# Patient Record
Sex: Female | Born: 1970
Health system: Southern US, Community
[De-identification: ages and names within clinical notes are randomized; demographics above are authoritative.]

## PROBLEM LIST (undated history)

## (undated) DIAGNOSIS — E039 Hypothyroidism, unspecified: Secondary | ICD-10-CM

## (undated) DIAGNOSIS — E119 Type 2 diabetes mellitus without complications: Secondary | ICD-10-CM

## (undated) DIAGNOSIS — K760 Fatty (change of) liver, not elsewhere classified: Secondary | ICD-10-CM

## (undated) DIAGNOSIS — L0390 Cellulitis, unspecified: Secondary | ICD-10-CM

## (undated) DIAGNOSIS — D649 Anemia, unspecified: Secondary | ICD-10-CM

## (undated) DIAGNOSIS — E669 Obesity, unspecified: Secondary | ICD-10-CM

## (undated) DIAGNOSIS — R7303 Prediabetes: Secondary | ICD-10-CM

## (undated) HISTORY — DX: Anemia, unspecified: D64.9

## (undated) HISTORY — DX: Hypothyroidism, unspecified: E03.9

## (undated) HISTORY — PX: ADENOIDECTOMY: SUR15

## (undated) HISTORY — DX: Prediabetes: R73.03

## (undated) HISTORY — DX: Type 2 diabetes mellitus without complications: E11.9

## (undated) HISTORY — DX: Cellulitis, unspecified: L03.90

## (undated) HISTORY — PX: FOOT SURGERY: SHX648

## (undated) HISTORY — DX: Obesity, unspecified: E66.9

---

## 2005-04-13 ENCOUNTER — Ambulatory Visit: Payer: Self-pay

## 2005-05-25 ENCOUNTER — Observation Stay: Payer: Self-pay

## 2005-07-08 ENCOUNTER — Inpatient Hospital Stay: Payer: Self-pay | Admitting: Obstetrics & Gynecology

## 2007-06-22 ENCOUNTER — Ambulatory Visit: Payer: Self-pay | Admitting: Internal Medicine

## 2007-11-26 ENCOUNTER — Other Ambulatory Visit: Payer: Self-pay

## 2007-11-26 ENCOUNTER — Emergency Department: Payer: Self-pay | Admitting: Internal Medicine

## 2007-11-26 ENCOUNTER — Ambulatory Visit: Payer: Self-pay | Admitting: Family Medicine

## 2007-11-27 ENCOUNTER — Ambulatory Visit: Payer: Self-pay | Admitting: Internal Medicine

## 2008-06-29 ENCOUNTER — Ambulatory Visit: Payer: Self-pay | Admitting: Internal Medicine

## 2010-04-05 ENCOUNTER — Ambulatory Visit: Payer: Self-pay | Admitting: Internal Medicine

## 2010-07-03 HISTORY — PX: ESOPHAGOGASTRODUODENOSCOPY: SHX1529

## 2010-12-19 ENCOUNTER — Ambulatory Visit: Payer: Self-pay | Admitting: Gastroenterology

## 2011-01-10 ENCOUNTER — Ambulatory Visit: Payer: Self-pay | Admitting: Internal Medicine

## 2011-01-24 ENCOUNTER — Ambulatory Visit: Payer: Self-pay | Admitting: Otolaryngology

## 2011-06-26 ENCOUNTER — Ambulatory Visit: Payer: Self-pay | Admitting: Internal Medicine

## 2014-07-10 ENCOUNTER — Ambulatory Visit: Payer: Self-pay | Admitting: Registered"

## 2015-01-01 LAB — HM PAP SMEAR: HM Pap smear: NORMAL

## 2015-01-08 ENCOUNTER — Other Ambulatory Visit: Payer: Self-pay | Admitting: Nurse Practitioner

## 2015-01-08 DIAGNOSIS — Z1231 Encounter for screening mammogram for malignant neoplasm of breast: Secondary | ICD-10-CM

## 2015-01-12 ENCOUNTER — Ambulatory Visit
Admission: RE | Admit: 2015-01-12 | Discharge: 2015-01-12 | Disposition: A | Discharge status: Home / Self Care | Payer: BLUE CROSS/BLUE SHIELD | Source: Ambulatory Visit | Attending: Nurse Practitioner | Admitting: Nurse Practitioner

## 2015-01-12 DIAGNOSIS — Z1231 Encounter for screening mammogram for malignant neoplasm of breast: Secondary | ICD-10-CM | POA: Diagnosis not present

## 2015-01-14 ENCOUNTER — Other Ambulatory Visit: Payer: Self-pay | Admitting: Nurse Practitioner

## 2015-01-14 DIAGNOSIS — N6310 Unspecified lump in the right breast, unspecified quadrant: Secondary | ICD-10-CM

## 2015-01-14 DIAGNOSIS — R928 Other abnormal and inconclusive findings on diagnostic imaging of breast: Secondary | ICD-10-CM

## 2015-01-19 ENCOUNTER — Ambulatory Visit
Admission: RE | Admit: 2015-01-19 | Discharge: 2015-01-19 | Disposition: A | Discharge status: Home / Self Care | Payer: BLUE CROSS/BLUE SHIELD | Source: Ambulatory Visit | Attending: Nurse Practitioner | Admitting: Nurse Practitioner

## 2015-01-19 ENCOUNTER — Other Ambulatory Visit: Payer: BLUE CROSS/BLUE SHIELD

## 2015-01-19 DIAGNOSIS — N6310 Unspecified lump in the right breast, unspecified quadrant: Secondary | ICD-10-CM

## 2015-01-19 DIAGNOSIS — R928 Other abnormal and inconclusive findings on diagnostic imaging of breast: Secondary | ICD-10-CM | POA: Insufficient documentation

## 2016-06-04 IMAGING — MG MM DIAG BREAST TOMO UNI RIGHT
4 series · 4 of 8 positions shown · non-contrast
Comparison: 01/12/2015

CLINICAL DATA: 44-year-old female, callback from baseline screening
mammogram for possible right breast mass

EXAM:
DIGITAL DIAGNOSTIC RIGHT MAMMOGRAM WITH 3D TOMOSYNTHESIS WITH CAD
ULTRASOUND RIGHT BREAST

[R MLO]
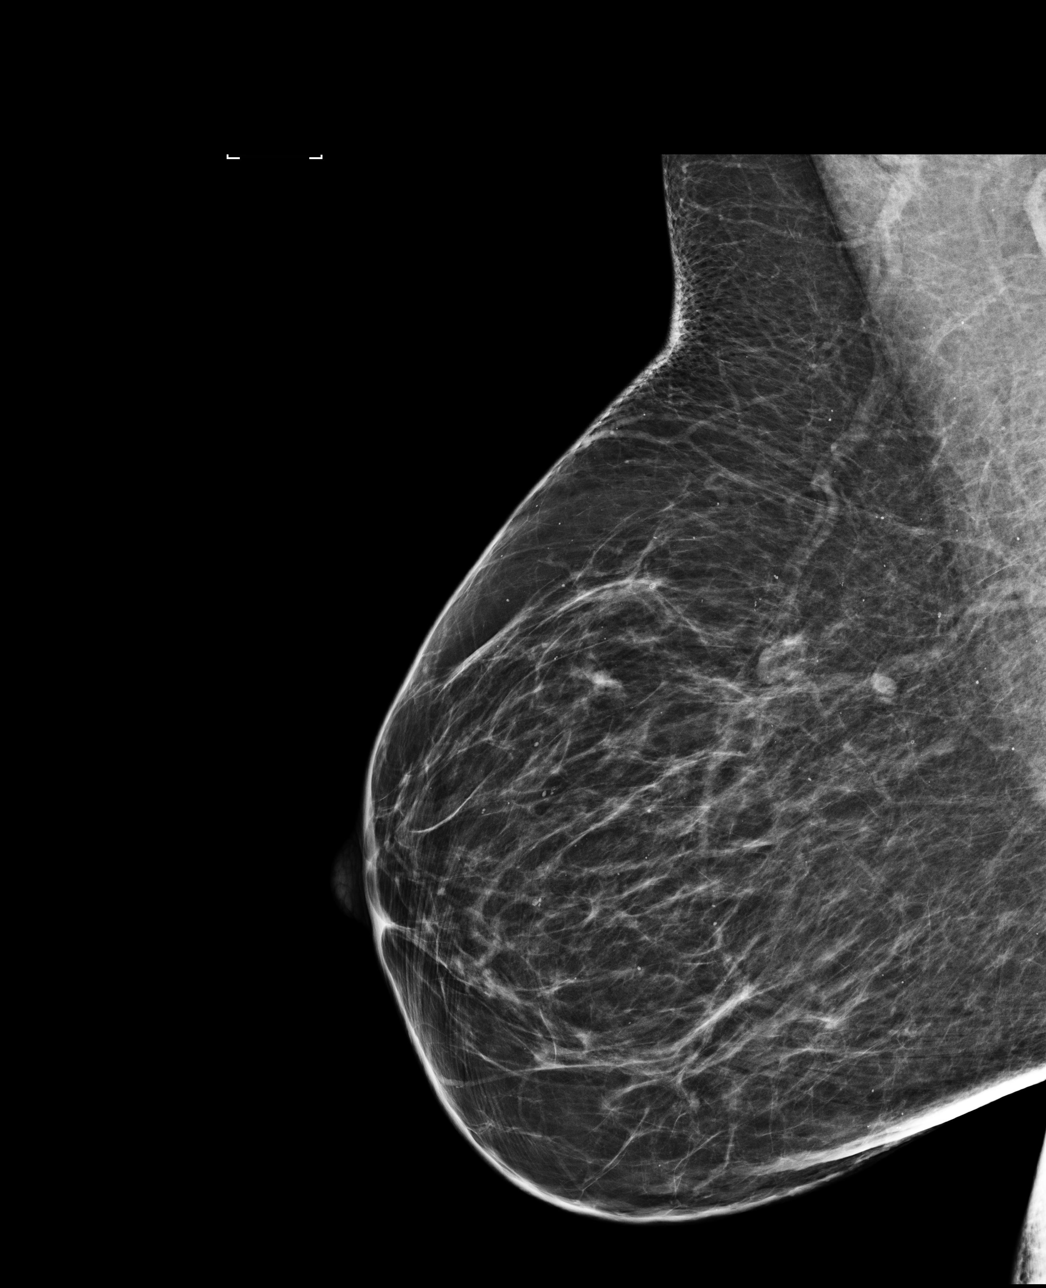

[R MLO synth-2D]
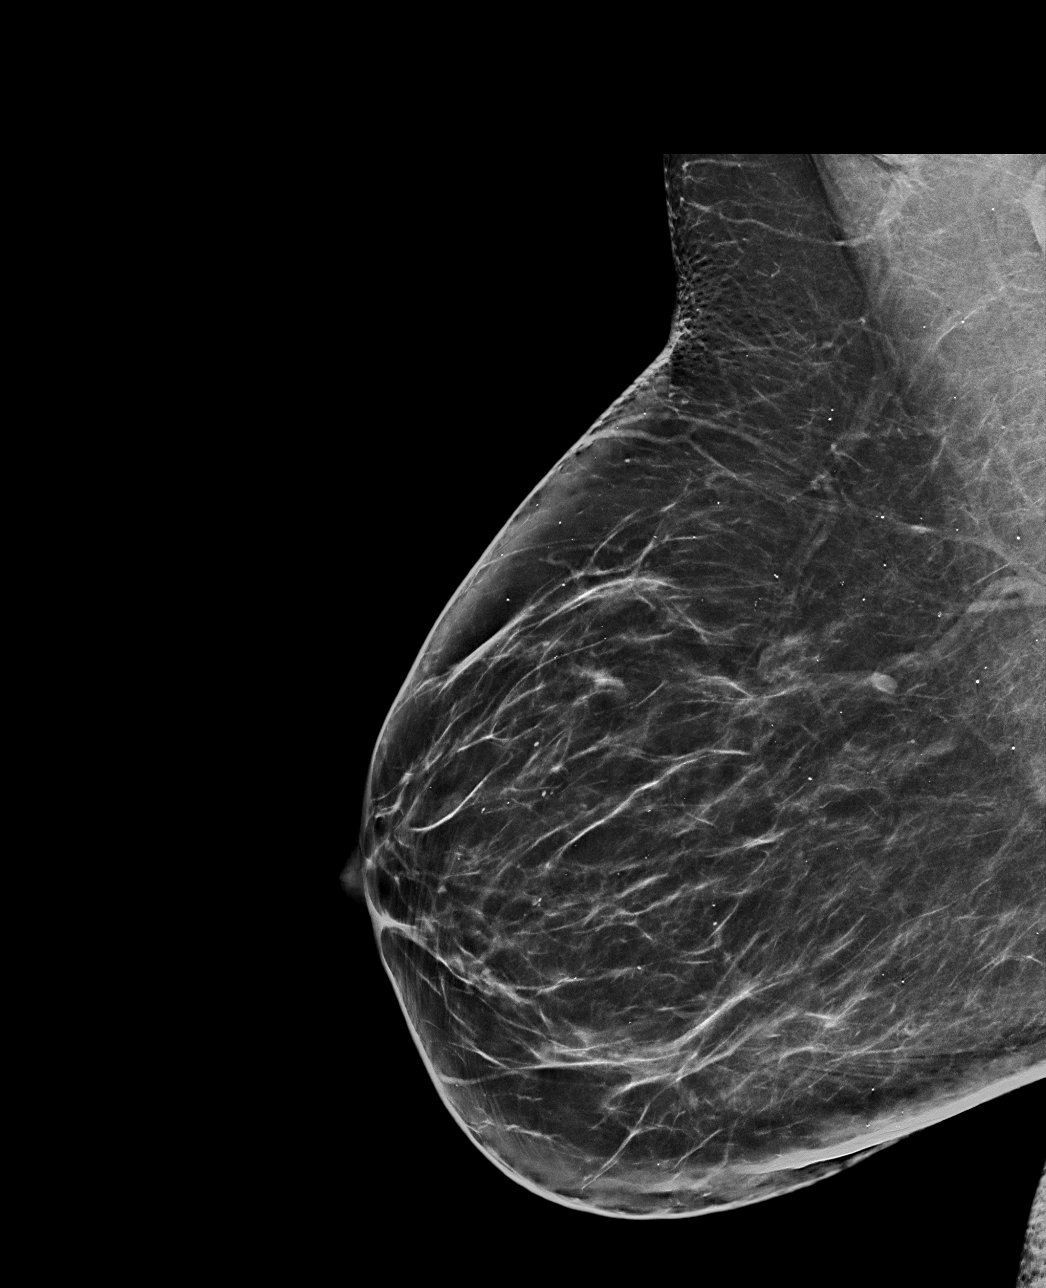

[R CC]
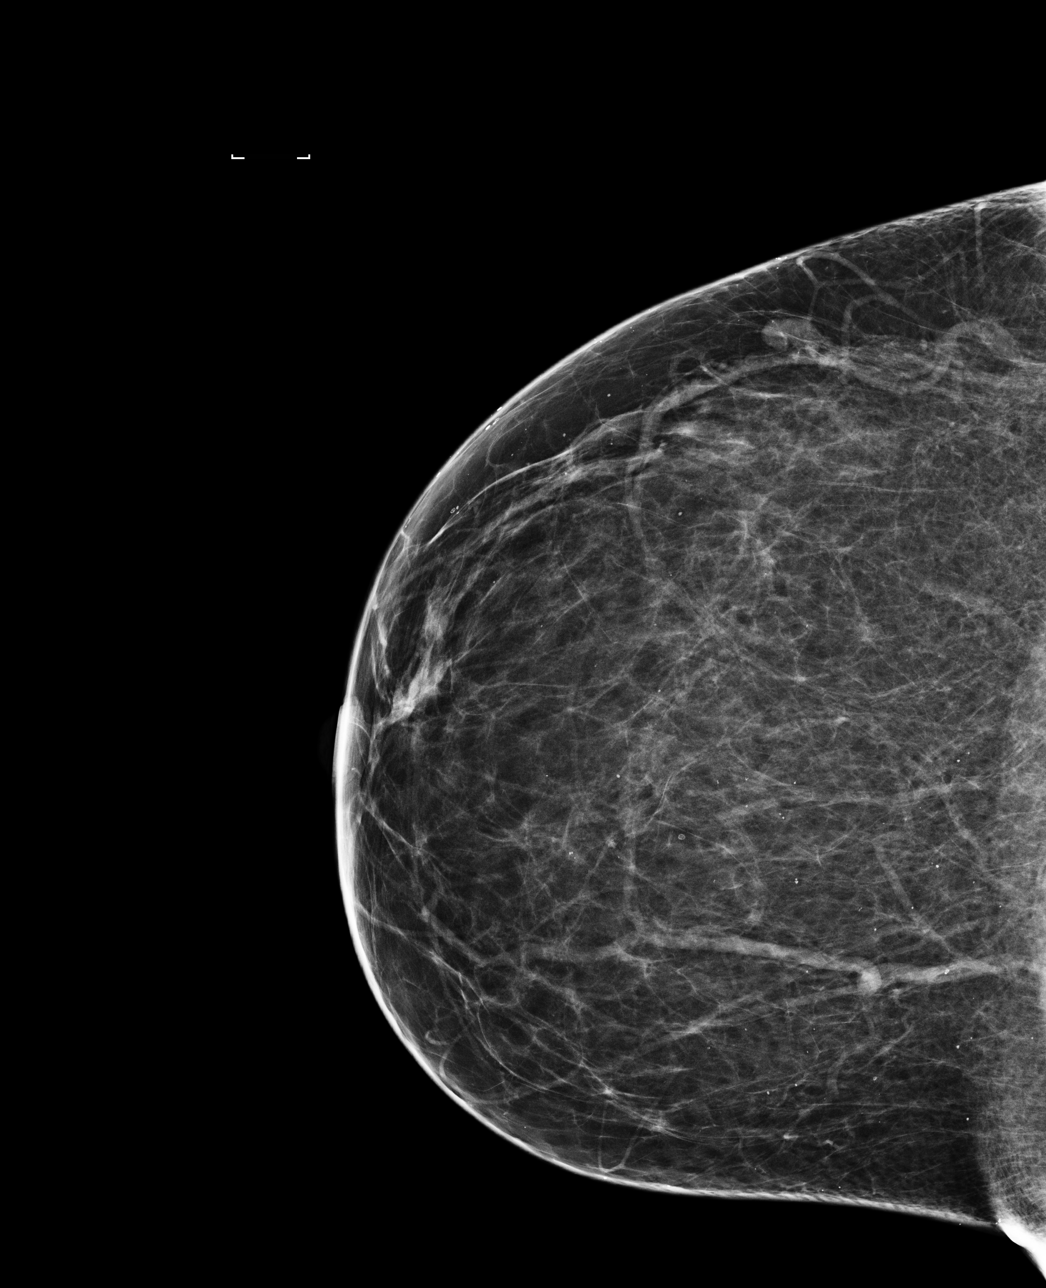

[R MLO tomo · tomo slice 41/80.0]
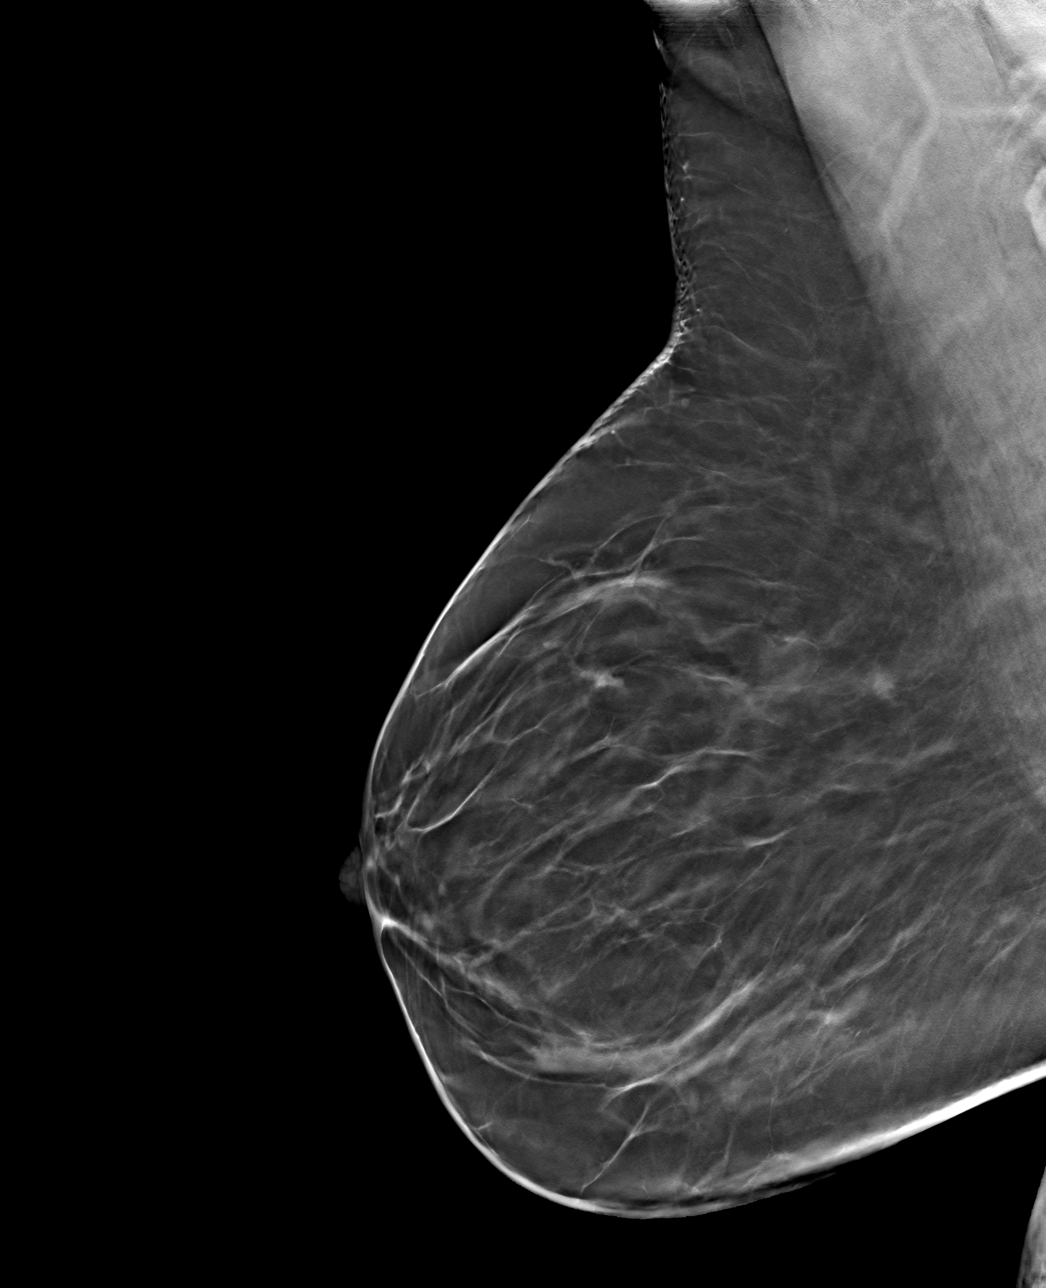

[4 of 8 positions shown; findings below may reference images not displayed]

ACR Breast Density Category b: There are scattered areas of
fibroglandular density.
FINDINGS: CC and MLO views of the right breast with tomosynthesis were
performed. On the additional views, there is an oval, circumscribed,
low-density mass within the upper, outer breast with the suggestion
of a fatty hilum. No additional abnormality is identified.

Mammographic images were processed with CAD.

On physical exam, no discrete mass is felt in the area of concern
within the upper, outer right breast.

Targeted ultrasound is performed, showing a normal appearing
intramammary lymph node at 9 o'clock, 12 cm from the nipple
measuring 10 x 5 x 7 mm, corresponding to the mass seen on
mammogram. No suspicious sonographic finding is seen in this region.
IMPRESSION: No mammographic or sonographic evidence of malignancy.

RECOMMENDATION:
Screening mammogram in one year.(Code:TI-J-ZN2)

I have discussed the findings and recommendations with the patient.
Results were also provided in writing at the conclusion of the
visit. If applicable, a reminder letter will be sent to the patient
regarding the next appointment.

BI-RADS CATEGORY  2: Benign.

## 2017-03-08 ENCOUNTER — Other Ambulatory Visit: Payer: Self-pay | Admitting: Registered"

## 2017-03-08 DIAGNOSIS — R1011 Right upper quadrant pain: Secondary | ICD-10-CM

## 2017-03-08 DIAGNOSIS — K5909 Other constipation: Secondary | ICD-10-CM

## 2017-03-15 ENCOUNTER — Ambulatory Visit
Admission: RE | Admit: 2017-03-15 | Discharge: 2017-03-15 | Disposition: A | Discharge status: Home / Self Care | Payer: BLUE CROSS/BLUE SHIELD | Source: Ambulatory Visit | Attending: Registered" | Admitting: Registered"

## 2017-03-15 DIAGNOSIS — K5909 Other constipation: Secondary | ICD-10-CM

## 2017-03-15 DIAGNOSIS — R1011 Right upper quadrant pain: Secondary | ICD-10-CM | POA: Insufficient documentation

## 2017-03-23 ENCOUNTER — Encounter: Payer: Self-pay | Admitting: Obstetrics and Gynecology

## 2017-03-23 ENCOUNTER — Ambulatory Visit (INDEPENDENT_AMBULATORY_CARE_PROVIDER_SITE_OTHER): Payer: BLUE CROSS/BLUE SHIELD | Admitting: Obstetrics and Gynecology

## 2017-03-23 DIAGNOSIS — N9089 Other specified noninflammatory disorders of vulva and perineum: Secondary | ICD-10-CM

## 2017-03-23 DIAGNOSIS — L720 Epidermal cyst: Secondary | ICD-10-CM | POA: Insufficient documentation

## 2017-03-23 NOTE — Progress Notes (Signed)
Obstetrics & Gynecology Office Visit   Chief Complaint  Patient presents with  . Vulvar lesion   History of Present Illness: 46 y.o. [id number] Who presents with concern for a vulvar lesion. The lesion has been present for the past several months. She has become more concerned about the lesion recently as it has become more firm. She has never had the sort of lesion before. It is located on her left vulva and is about 1 cm long. It is not leaking any fluid nor bleeding. Nothing she has tried makes the lesion smaller or larger. She has no discomfort or any associated symptoms. She has regular menses, which occur every 28 days, and last 5-6 days. Her bleeding she describes as normal for menses. She denies the passage of clots. She denies dysmenorrhea. She denies weight loss, early satiety, new constipation. She denies fevers and chills. She denies any inciting event. Her last Pap smear was about 2 years ago and was normal. She denies a history of abnormal Pap smears. For contraception, she and her husband rely on his vasectomy. She denies a history of sexual transmitted infections.   Past Medical History:  Diagnosis Date  . Hypothyroidism   . Prediabetes    Past Surgical History:  Procedure Laterality Date  . FOOT SURGERY     Gynecologic History: Patient's last menstrual period was 03/05/2017.  Obstetric History: [id number], s/p SVD x 4 without complication  Family History  Problem Relation Age of Onset  . Glaucoma Mother   . Diabetes Mellitus II Paternal Grandfather     Social History   Social History  . Marital status: Married    Spouse name: N/A  . Number of children: N/A  . Years of education: N/A   Occupational History  . Not on file.   Social History Main Topics  . Smoking status: Never Smoker  . Smokeless tobacco: Never Used  . Alcohol use Yes  . Drug use: No  . Sexual activity: Yes    Birth control/ protection: None   Other Topics Concern  . Not on file   Social  History Narrative  . No narrative on file    No Known Allergies  Prior to Admission medications   Medication Sig Start Date End Date Taking? Authorizing Provider  NATURE-THROID 97.5 MG TABS  03/08/17   [provider]    Review of Systems  Constitutional: Negative.   HENT: Negative.   Eyes: Negative.   Respiratory: Negative.   Cardiovascular: Negative.   Gastrointestinal: Negative.   Genitourinary: Negative.   Musculoskeletal: Positive for back pain. Negative for falls, joint pain, myalgias and neck pain.  Skin: Negative.   Neurological: Negative.   Psychiatric/Behavioral: Negative.      Physical Exam BP 126/84   Ht  (1.702 m)   Wt 245 lb (111.1 kg)   LMP 03/05/2017   BMI 38.37 kg/m  Patient's last menstrual period was 03/05/2017. Physical Exam  Constitutional: She is oriented to person, place, and time. She appears well-developed and well-nourished. No distress.  Genitourinary: Vagina normal and uterus normal. Pelvic exam was performed with patient supine. There is no rash, tenderness or lesion on the right labia.  There is lesion (0.8 cm elongated lesion that is white, just below the skin surface, is mobile, nontender, no erythema,) on the left labia. There is no rash or tenderness on the left labia.    Vagina exhibits no lesion. No erythema, tenderness or bleeding in the vagina. No signs of injury  around the vagina. No vaginal discharge found. Right adnexum does not display mass, does not display tenderness and does not display fullness. Left adnexum does not display mass, does not display tenderness and does not display fullness. Cervix does not exhibit motion tenderness, lesion, discharge or polyp.   Uterus is mobile and anteverted. Uterus is not enlarged, tender, exhibiting a mass or irregular (is regular).  HENT:  Head: Normocephalic and atraumatic.  Eyes: No scleral icterus.  Neck: Normal range of motion.  Cardiovascular: Normal rate, regular rhythm  and normal heart sounds.   Pulmonary/Chest: Effort normal and breath sounds normal. No respiratory distress. She has no wheezes. She has no rales. She exhibits no tenderness.  Abdominal: Soft. Bowel sounds are normal. She exhibits no distension and no mass. There is no tenderness. There is no rebound and no guarding. No hernia.  Neurological: She is alert and oriented to person, place, and time. No cranial nerve deficit.  Psychiatric: She has a normal mood and affect. Her behavior is normal. Judgment normal.    Female chaperone present for pelvic and breast  portions of the physical exam  Assessment: 46 y.o. [id number] female with epidermal inclusion cyst of the left vulva.  Plan: Problem List Items Addressed This Visit    Vulvar lesion   Epidermal inclusion cyst    Patient reassured regarding the lesion. She is quite anxious about the lesion. So, will have her follow up in 4-6 months for assurance of stability of the lesion.   Thomasene Mohair, MD 03/23/2017 3:04 PM

## 2017-07-27 ENCOUNTER — Other Ambulatory Visit: Payer: Self-pay | Admitting: Obstetrics and Gynecology

## 2017-08-03 ENCOUNTER — Ambulatory Visit (INDEPENDENT_AMBULATORY_CARE_PROVIDER_SITE_OTHER): Payer: BLUE CROSS/BLUE SHIELD | Admitting: Obstetrics and Gynecology

## 2017-08-03 ENCOUNTER — Encounter: Payer: Self-pay | Admitting: Obstetrics and Gynecology

## 2017-08-03 VITALS — BP 138/86 | Ht 66.0 in | Wt 246.0 lb

## 2017-08-03 DIAGNOSIS — N644 Mastodynia: Secondary | ICD-10-CM | POA: Diagnosis not present

## 2017-08-03 NOTE — Progress Notes (Signed)
Obstetrics & Gynecology Office Visit   Chief Complaint  Patient presents with  . Breast Problem   History of Present Illness: 47 y.o. G43P4004 female who presents with mild discomfort on her right lateral breast for the past week.  She states the pain is to the far lateral aspect of her breast area.  She denies skin changes, no bumps noted. She denies recent trauma to the area.  She denies leakage of blood or fluid from her nipple area.    Past Medical History:  Diagnosis Date  . Hypothyroidism   . Prediabetes     Past Surgical History:  Procedure Laterality Date  . FOOT SURGERY      Gynecologic History: No LMP recorded.  Obstetric History: [id number]  Family History  Problem Relation Age of Onset  . Glaucoma Mother   . Diabetes Mellitus II Paternal Grandfather     Social History   Socioeconomic History  . Marital status: Married    Spouse name: Not on file  . Number of children: Not on file  . Years of education: Not on file  . Highest education level: Not on file  Social Needs  . Financial resource strain: Not on file  . Food insecurity - worry: Not on file  . Food insecurity - inability: Not on file  . Transportation needs - medical: Not on file  . Transportation needs - non-medical: Not on file  Occupational History  . Not on file  Tobacco Use  . Smoking status: Never Smoker  . Smokeless tobacco: Never Used  Substance and Sexual Activity  . Alcohol use: Yes  . Drug use: No  . Sexual activity: Yes    Birth control/protection: None  Other Topics Concern  . Not on file  Social History Narrative  . Not on file    No Known Allergies  Prior to Admission medications   Medication Sig Start Date End Date Taking? Authorizing Provider  NATURE-THROID 97.5 MG TABS  03/08/17   [provider]    Review of Systems  Constitutional: Negative.   HENT: Negative.   Eyes: Negative.   Respiratory: Negative.   Cardiovascular: Negative.   Gastrointestinal:  Negative.   Genitourinary: Negative.   Musculoskeletal: Negative.   Skin: Negative.        Except as noted in the HPI  Neurological: Negative.   Psychiatric/Behavioral: Negative.      Physical Exam BP 138/86   Ht 5\' 6"  (1.676 m)   Wt 246 lb (111.6 kg)   BMI 39.71 kg/m  No LMP recorded. Physical Exam  Constitutional: She is oriented to person, place, and time. She appears well-developed and well-nourished. No distress.  HENT:  Head: Normocephalic and atraumatic.  Eyes: Conjunctivae are normal. No scleral icterus.  Neck: Normal range of motion. Neck supple. No thyromegaly present.  Cardiovascular: Normal rate and regular rhythm.  Pulmonary/Chest: Effort normal and breath sounds normal. No respiratory distress. She exhibits tenderness (mild ttp overlying anterior latissimus dorsi of her right side). She exhibits no mass. Right breast exhibits no inverted nipple, no mass, no nipple discharge, no skin change and no tenderness. Left breast exhibits no inverted nipple, no mass, no nipple discharge and no skin change.  Musculoskeletal: Normal range of motion. She exhibits no edema.  Lymphadenopathy:    She has no cervical adenopathy.  Neurological: She is alert and oriented to person, place, and time. No cranial nerve deficit.  Psychiatric: She has a normal mood and affect. Her behavior is normal. Judgment  normal.   Female chaperone present for pelvic and breast  portions of the physical exam.  Patient's breast examined in the upright and recumbent positions.   Assessment: 47 y.o. [id number] female here for  1. Breast tenderness      Plan: Problem List Items Addressed This Visit    None    Visit Diagnoses    Breast tenderness    -  Primary     Patient reassured with no lesion noted.  She is strongly encouraged to get a routine screening mammogram since no focal lesions were identified and her tenderness appears to be overlying a muscles with no overlying breast tissue.  Thomasene MohairStephen  Kirbie Stodghill, MD 08/06/2017 1:53 PM

## 2017-08-06 ENCOUNTER — Encounter: Payer: Self-pay | Admitting: Obstetrics and Gynecology

## 2018-01-14 DIAGNOSIS — E559 Vitamin D deficiency, unspecified: Secondary | ICD-10-CM | POA: Diagnosis not present

## 2018-01-14 DIAGNOSIS — E538 Deficiency of other specified B group vitamins: Secondary | ICD-10-CM | POA: Diagnosis not present

## 2018-01-14 DIAGNOSIS — E063 Autoimmune thyroiditis: Secondary | ICD-10-CM | POA: Diagnosis not present

## 2018-01-14 DIAGNOSIS — R7982 Elevated C-reactive protein (CRP): Secondary | ICD-10-CM | POA: Diagnosis not present

## 2018-01-23 DIAGNOSIS — F430 Acute stress reaction: Secondary | ICD-10-CM | POA: Diagnosis not present

## 2018-01-23 DIAGNOSIS — R5383 Other fatigue: Secondary | ICD-10-CM | POA: Diagnosis not present

## 2018-01-23 DIAGNOSIS — E119 Type 2 diabetes mellitus without complications: Secondary | ICD-10-CM | POA: Diagnosis not present

## 2018-01-23 DIAGNOSIS — R7982 Elevated C-reactive protein (CRP): Secondary | ICD-10-CM | POA: Diagnosis not present

## 2018-01-30 ENCOUNTER — Other Ambulatory Visit: Payer: Self-pay

## 2018-01-30 ENCOUNTER — Encounter: Payer: Self-pay | Admitting: Emergency Medicine

## 2018-01-30 ENCOUNTER — Ambulatory Visit (INDEPENDENT_AMBULATORY_CARE_PROVIDER_SITE_OTHER)
Admit: 2018-01-30 | Discharge: 2018-01-30 | Disposition: A | Discharge status: Home / Self Care | Payer: BLUE CROSS/BLUE SHIELD | Attending: Family Medicine | Admitting: Family Medicine

## 2018-01-30 ENCOUNTER — Ambulatory Visit
Admission: EM | Admit: 2018-01-30 | Discharge: 2018-01-30 | Disposition: A | Discharge status: Home / Self Care | Payer: BLUE CROSS/BLUE SHIELD | Attending: Family Medicine | Admitting: Family Medicine

## 2018-01-30 DIAGNOSIS — M7989 Other specified soft tissue disorders: Secondary | ICD-10-CM

## 2018-01-30 DIAGNOSIS — R2241 Localized swelling, mass and lump, right lower limb: Secondary | ICD-10-CM | POA: Diagnosis not present

## 2018-01-30 DIAGNOSIS — L559 Sunburn, unspecified: Secondary | ICD-10-CM

## 2018-01-30 DIAGNOSIS — R600 Localized edema: Secondary | ICD-10-CM | POA: Diagnosis not present

## 2018-01-30 DIAGNOSIS — M79661 Pain in right lower leg: Secondary | ICD-10-CM

## 2018-01-30 MED ORDER — DOXYCYCLINE HYCLATE 100 MG PO CAPS: 100.0000 mg | ORAL_CAPSULE | Freq: Two times a day (BID) | ORAL | 0 refills | Status: DC

## 2018-01-30 NOTE — ED Provider Notes (Signed)
[facility]    CSN: [id number] Arrival date & time: 01/30/18  [id number]  History   Chief Complaint Chief Complaint  Patient presents with  . Leg Swelling   HPI  47 year old female presents with right leg swelling.  Patient recently suffered a severe sunburn on both of her lower legs.  She states that this is now improving.  However, over the past 12 days following the burn she has had leg swelling, particularly of the right leg.  She has recently had long travel.  She states that she traveled 9 hours to [place].  She has been elevating her leg and using over-the-counter topicals for her sunburn.  She has had improvement in her burn but has yet to have improvement in her swelling.  No reports of shortness of breath.  No known relieving factors.  Additionally, patient has an open wound at the back of her right knee.  This was from a ruptured blister.  No other reported symptoms.  No other complaints.  Past Medical History:  Diagnosis Date  . Hypothyroidism   . Prediabetes    Patient Active Problem List   Diagnosis Date Noted  . Vulvar lesion 03/23/2017  . Epidermal inclusion cyst 03/23/2017   Past Surgical History:  Procedure Laterality Date  . FOOT SURGERY     OB History    Gravida  4   Para  4   Term  4   Preterm      AB      Living  4     SAB      TAB      Ectopic      Multiple      Live Births  4            Home Medications    Prior to Admission medications   Medication Sig Start Date End Date Taking? Authorizing Provider  metFORMIN (GLUCOPHAGE) 850 MG tablet  01/23/18  Yes [provider]  NATURE-THROID 97.5 MG TABS  03/08/17  Yes [provider]  doxycycline (VIBRAMYCIN) 100 MG capsule Take 1 capsule (100 mg total) by mouth 2 (two) times daily. 01/30/18   Tommie Sams, DO    Family History Family History  Problem Relation Age of Onset  . Glaucoma Mother   . Diabetes Mellitus II Paternal Grandfather   .  Hypertension Father     Social History Social History   Tobacco Use  . Smoking status: Never Smoker  . Smokeless tobacco: Never Used  Substance Use Topics  . Alcohol use: Yes  . Drug use: No     Allergies   Patient has no known allergies.   Review of Systems Review of Systems  Respiratory: Negative.   Cardiovascular: Positive for leg swelling.  Skin:       Sunburn.    Physical Exam Triage Vital Signs ED Triage Vitals  Enc Vitals Group     BP 01/30/18 1008 (!) 141/80     Pulse Rate 01/30/18 1008 60     Resp 01/30/18 1008 18     Temp 01/30/18 1008 99 F (37.2 C)     Temp Source 01/30/18 1008 Oral     SpO2 01/30/18 1008 100 %     Weight 01/30/18 1005 240 lb (108.9 kg)     Height 01/30/18 1005 5\' 7"  (1.702 m)     Head Circumference --      Peak Flow --      Pain Score 01/30/18 1005  2     Pain Loc --      Pain Edu? --      Excl. in GC? --    Updated Vital Signs BP (!) 141/80 (BP Location: Right Arm)   Pulse 60   Temp 99 F (37.2 C) (Oral)   Resp 18   Ht 5\' 7"  (1.702 m)   Wt 240 lb (108.9 kg)   LMP 01/28/2018   SpO2 100%   BMI 37.59 kg/m   Visual Acuity Right Eye Distance:   Left Eye Distance:   Bilateral Distance:    Right Eye Near:   Left Eye Near:    Bilateral Near:     Physical Exam  Constitutional: She is oriented to person, place, and time. She appears well-developed. No distress.  Pulmonary/Chest: Effort normal. No respiratory distress.  Musculoskeletal:  Right lower leg -patient with 1-2+ edema.  Her right calf is considerably larger than the left.  Measures 52 cm versus 47 cm.  Neurological: She is alert and oriented to person, place, and time.  Skin:  Right lower extremity with erythema posteriorly.  Mild warmth.  Psychiatric: She has a normal mood and affect. Her behavior is normal.  Nursing note and vitals reviewed.  UC Treatments / Results  Labs (all labs ordered are listed, but only abnormal results are displayed) Labs Reviewed  - No data to display  EKG None  Radiology [place] Venous Img Lower Unilateral Right  Result Date: 01/30/2018 CLINICAL DATA:  Right lower extremity pain and edema for the past 2 weeks. Evaluate for DVT. EXAM: RIGHT LOWER EXTREMITY VENOUS DOPPLER ULTRASOUND TECHNIQUE: Gray-scale sonography with graded compression, as well as color Doppler and duplex ultrasound were performed to evaluate the lower extremity deep venous systems from the level of the common femoral vein and including the common femoral, femoral, profunda femoral, popliteal and calf veins including the posterior tibial, peroneal and gastrocnemius veins when visible. The superficial great saphenous vein was also interrogated. Spectral Doppler was utilized to evaluate flow at rest and with distal augmentation maneuvers in the common femoral, femoral and popliteal veins. COMPARISON:  None. FINDINGS: Contralateral Common Femoral Vein: Respiratory phasicity is normal and symmetric with the symptomatic side. No evidence of thrombus. Normal compressibility. Common Femoral Vein: No evidence of thrombus. Normal compressibility, respiratory phasicity and response to augmentation. Saphenofemoral Junction: No evidence of thrombus. Normal compressibility and flow on color Doppler imaging. Profunda Femoral Vein: No evidence of thrombus. Normal compressibility and flow on color Doppler imaging. Femoral Vein: No evidence of thrombus. Normal compressibility, respiratory phasicity and response to augmentation. Popliteal Vein: No evidence of thrombus. Normal compressibility, respiratory phasicity and response to augmentation. Calf Veins: Appear patent where imaged. Superficial Great Saphenous Vein: No evidence of thrombus. Normal compressibility. Venous Reflux:  None. Other Findings:  None. IMPRESSION: No evidence of DVT within the right lower extremity. Electronically Signed   By: Simonne ComeJohn  Watts M.D.   On: 01/30/2018 11:16    Procedures Procedures (including critical  care time)  Medications Ordered in UC Medications - No data to display  Initial Impression / Assessment and Plan / UC Course  I have reviewed the triage vital signs and the nursing notes.  Pertinent labs & imaging results that were available during my care of the patient were reviewed by me and considered in my medical decision making (see chart for details).    47 year old female presents with right leg swelling. Concern for DVT. US obtained today and was negative.  Slight  concern for cellulitis.  However, it is difficult to tail in the setting of sunburn.  Advised continue supportive care.  If fails to improve or worsens, start doxycycline.  Final Clinical Impressions(s) / UC Diagnoses   Final diagnoses:  Right leg swelling     Discharge Instructions     Rest, elevate.  Compression.  Antibiotic if you fail to improve or worsen.  Take care  Dr. Adriana Simas     ED Prescriptions    Medication Sig Dispense Auth. Provider   doxycycline (VIBRAMYCIN) 100 MG capsule Take 1 capsule (100 mg total) by mouth 2 (two) times daily. 14 capsule Tommie Sams, DO     Controlled Substance Prescriptions [place] Controlled Substance Registry consulted? Not Applicable   Tommie Sams, DO 01/30/18 1136

## 2018-01-30 NOTE — Discharge Instructions (Signed)
Rest, elevate.  Compression.  Antibiotic if you fail to improve or worsen.  Take care  Dr. Adriana Simasook

## 2018-01-30 NOTE — ED Triage Notes (Signed)
Patient c/o sunburn to her legs 12 days ago. Patient now reports swelling to her right leg and tenderness that has improved.

## 2018-02-11 ENCOUNTER — Ambulatory Visit
Admission: EM | Admit: 2018-02-11 | Discharge: 2018-02-11 | Disposition: A | Discharge status: Home / Self Care | Payer: BLUE CROSS/BLUE SHIELD | Attending: Family Medicine | Admitting: Family Medicine

## 2018-02-11 ENCOUNTER — Other Ambulatory Visit: Payer: Self-pay

## 2018-02-11 DIAGNOSIS — R400 Somnolence: Secondary | ICD-10-CM | POA: Diagnosis not present

## 2018-02-11 DIAGNOSIS — R5383 Other fatigue: Secondary | ICD-10-CM | POA: Diagnosis not present

## 2018-02-11 DIAGNOSIS — T383X5A Adverse effect of insulin and oral hypoglycemic [antidiabetic] drugs, initial encounter: Secondary | ICD-10-CM | POA: Diagnosis not present

## 2018-02-11 LAB — COMPREHENSIVE METABOLIC PANEL
ALK PHOS: 69 U/L (ref 38–126)
ALT: 19 U/L (ref 0–44)
AST: 23 U/L (ref 15–41)
Albumin: 4.2 g/dL (ref 3.5–5.0)
Anion gap: 10 (ref 5–15)
BUN: 14 mg/dL (ref 6–20)
CALCIUM: 9 mg/dL (ref 8.9–10.3)
CO2: 23 mmol/L (ref 22–32)
CREATININE: 0.69 mg/dL (ref 0.44–1.00)
Chloride: 105 mmol/L (ref 98–111)
Glucose, Bld: 112 mg/dL — ABNORMAL HIGH (ref 70–99)
Potassium: 4.1 mmol/L (ref 3.5–5.1)
Sodium: 138 mmol/L (ref 135–145)
TOTAL PROTEIN: 8 g/dL (ref 6.5–8.1)
Total Bilirubin: 0.4 mg/dL (ref 0.3–1.2)

## 2018-02-11 NOTE — ED Provider Notes (Signed)
[facility]    CSN: [id number] Arrival date & time: 02/11/18  [id number]   History   Chief Complaint Drowsiness  HPI   47 year old female presents with drowsiness.  Patient states that she recently started taking metformin.  She states that it was approximately 1 week ago.  She states that she has had ongoing drowsiness.  She is concerned that she is having a medication side effect.  She has now stopped the metformin and seems to be improving.  She states that in addition to the drowsiness, she is been having "brain fog" and fatigue.  Symptoms are now improving.  Patient is concerned that she has lactic acidosis because she read this side effect online.  No fevers or chills.  No reports of diarrhea.  No other associated symptoms.  No other complaints.  Past Medical History:  Diagnosis Date  . Hypothyroidism   . Prediabetes     Patient Active Problem List   Diagnosis Date Noted  . Vulvar lesion 03/23/2017  . Epidermal inclusion cyst 03/23/2017    Past Surgical History:  Procedure Laterality Date  . FOOT SURGERY      OB History    Gravida  4   Para  4   Term  4   Preterm      AB      Living  4     SAB      TAB      Ectopic      Multiple      Live Births  4            Home Medications    Prior to Admission medications   Medication Sig Start Date End Date Taking? Authorizing Provider  NATURE-THROID 97.5 MG TABS  03/08/17  Yes [provider]    Family History Family History  Problem Relation Age of Onset  . Glaucoma Mother   . Diabetes Mellitus II Paternal Grandfather   . Hypertension Father     Social History Social History   Tobacco Use  . Smoking status: Never Smoker  . Smokeless tobacco: Never Used  Substance Use Topics  . Alcohol use: Yes    Comment: rare  . Drug use: No     Allergies   Patient has no known allergies.   Review of Systems Review of Systems  Constitutional: Positive for fatigue.    Neurological:       Drowsiness.   Physical Exam Triage Vital Signs ED Triage Vitals  Enc Vitals Group     BP 02/11/18 1011 (!) 148/87     Pulse Rate 02/11/18 1011 (!) 59     Resp 02/11/18 1011 18     Temp 02/11/18 1011 98.5 F (36.9 C)     Temp Source 02/11/18 1011 Oral     SpO2 02/11/18 1011 99 %     Weight 02/11/18 1008 240 lb (108.9 kg)     Height 02/11/18 1008 5\' 7"  (1.702 m)     Head Circumference --      Peak Flow --      Pain Score 02/11/18 1008 0     Pain Loc --      Pain Edu? --      Excl. in GC? --    Updated Vital Signs BP (!) 148/87 (BP Location: Left Arm)   Pulse (!) 59   Temp 98.5 F (36.9 C) (Oral)   Resp 18   Ht 5\' 7"  (1.702 m)   Wt 108.9 kg  LMP 01/28/2018   SpO2 99%   BMI 37.59 kg/m   Visual Acuity Right Eye Distance:   Left Eye Distance:   Bilateral Distance:    Right Eye Near:   Left Eye Near:    Bilateral Near:     Physical Exam  Constitutional: She is oriented to person, place, and time. She appears well-developed. No distress.  HENT:  Head: Normocephalic and atraumatic.  Cardiovascular: Normal rate and regular rhythm.  Pulmonary/Chest: Effort normal. She has no wheezes. She has no rales.  Neurological: She is alert and oriented to person, place, and time.  Psychiatric: She has a normal mood and affect. Her behavior is normal.  Nursing note and vitals reviewed.  UC Treatments / Results  Labs (all labs ordered are listed, but only abnormal results are displayed) Labs Reviewed  COMPREHENSIVE METABOLIC PANEL - Abnormal; Notable for the following components:      Result Value   Glucose, Bld 112 (*)    All other components within normal limits    EKG None  Radiology No results found.  Procedures Procedures (including critical care time)  Medications Ordered in UC Medications - No data to display  Initial Impression / Assessment and Plan / UC Course  I have reviewed the triage vital signs and the nursing  notes.  Pertinent labs & imaging results that were available during my care of the patient were reviewed by me and considered in my medical decision making (see chart for details).    47 year old female presents with an adverse medication side effect.  Improving since she stopped metformin.  Continue to not take metformin.  Discussed with her primary care physician about further treatment of her blood sugars/insulin resistance.  Metabolic panel obtained today and was unremarkable.  Final Clinical Impressions(s) / UC Diagnoses   Final diagnoses:  Adverse effect of metformin, initial encounter     Discharge Instructions     No more metformin.  Labs    ED Prescriptions    None     Controlled Substance Prescriptions [place] Controlled Substance Registry consulted? Not Applicable   Tommie SamsCook, Huckleberry Martinson G, DO 02/11/18 1245

## 2018-02-11 NOTE — ED Triage Notes (Signed)
Patient states that she recently started metformin around 3 weeks ago and stopped it on Saturday. Patient states that she was having drowsiness. Patient states that she googled symptoms and thought it could possibly be lactic acidosis. Patient states that she has noticed some brain fog and tiredness. States that since stopping the medication it has improved.   Patient would also like for Dr. Adriana Simasook to look at her legs since finishing the doxy. Reports resolving symptoms.

## 2018-02-11 NOTE — Discharge Instructions (Signed)
No more metformin.  Labs

## 2018-04-08 DIAGNOSIS — M25571 Pain in right ankle and joints of right foot: Secondary | ICD-10-CM | POA: Diagnosis not present

## 2018-05-07 DIAGNOSIS — D508 Other iron deficiency anemias: Secondary | ICD-10-CM | POA: Diagnosis not present

## 2018-05-07 DIAGNOSIS — R7982 Elevated C-reactive protein (CRP): Secondary | ICD-10-CM | POA: Diagnosis not present

## 2018-05-07 DIAGNOSIS — E538 Deficiency of other specified B group vitamins: Secondary | ICD-10-CM | POA: Diagnosis not present

## 2018-05-07 DIAGNOSIS — E559 Vitamin D deficiency, unspecified: Secondary | ICD-10-CM | POA: Diagnosis not present

## 2018-05-07 DIAGNOSIS — E063 Autoimmune thyroiditis: Secondary | ICD-10-CM | POA: Diagnosis not present

## 2018-05-07 DIAGNOSIS — E119 Type 2 diabetes mellitus without complications: Secondary | ICD-10-CM | POA: Diagnosis not present

## 2018-05-16 DIAGNOSIS — F430 Acute stress reaction: Secondary | ICD-10-CM | POA: Diagnosis not present

## 2018-05-16 DIAGNOSIS — E119 Type 2 diabetes mellitus without complications: Secondary | ICD-10-CM | POA: Diagnosis not present

## 2018-05-16 DIAGNOSIS — R5383 Other fatigue: Secondary | ICD-10-CM | POA: Diagnosis not present

## 2018-05-16 DIAGNOSIS — R7982 Elevated C-reactive protein (CRP): Secondary | ICD-10-CM | POA: Diagnosis not present

## 2018-06-10 DIAGNOSIS — D649 Anemia, unspecified: Secondary | ICD-10-CM | POA: Insufficient documentation

## 2018-06-10 DIAGNOSIS — F988 Other specified behavioral and emotional disorders with onset usually occurring in childhood and adolescence: Secondary | ICD-10-CM | POA: Diagnosis not present

## 2018-06-10 DIAGNOSIS — E669 Obesity, unspecified: Secondary | ICD-10-CM | POA: Diagnosis not present

## 2018-06-10 DIAGNOSIS — R7303 Prediabetes: Secondary | ICD-10-CM | POA: Diagnosis not present

## 2018-06-10 DIAGNOSIS — E559 Vitamin D deficiency, unspecified: Secondary | ICD-10-CM | POA: Diagnosis not present

## 2018-07-26 ENCOUNTER — Other Ambulatory Visit: Payer: Self-pay | Admitting: Obstetrics and Gynecology

## 2018-07-26 DIAGNOSIS — Z1231 Encounter for screening mammogram for malignant neoplasm of breast: Secondary | ICD-10-CM

## 2018-07-30 ENCOUNTER — Ambulatory Visit
Admit: 2018-07-30 | Discharge: 2018-07-30 | Disposition: A | Discharge status: Home / Self Care | Payer: BLUE CROSS/BLUE SHIELD | Attending: Family Medicine | Admitting: Family Medicine

## 2018-07-30 ENCOUNTER — Ambulatory Visit
Admission: EM | Admit: 2018-07-30 | Discharge: 2018-07-30 | Disposition: A | Discharge status: Home / Self Care | Payer: BLUE CROSS/BLUE SHIELD | Attending: Family Medicine | Admitting: Family Medicine

## 2018-07-30 ENCOUNTER — Other Ambulatory Visit: Payer: Self-pay

## 2018-07-30 DIAGNOSIS — R1011 Right upper quadrant pain: Secondary | ICD-10-CM | POA: Insufficient documentation

## 2018-07-30 DIAGNOSIS — K828 Other specified diseases of gallbladder: Secondary | ICD-10-CM | POA: Diagnosis not present

## 2018-07-30 NOTE — ED Provider Notes (Signed)
[facility]    CSN: [id number] Arrival date & time: 07/30/18  0947  History   Chief Complaint Chief Complaint  Patient presents with  . Back Pain    appt   HPI  48 year old female presents with multiple complaints.  Patient reports that she has not been feeling well for the past few months.  She has a "sensation" in her back.  She states that it comes and goes.  She states that she feels like something is wrong.  It is located in her upper back.  She reports that she is also had some "right side cramping".  Tends to occur with meals.  She believes she may have a gallbladder issue.  She also endorses constipation, nausea, decreased appetite.  She also mentions that she feels like she is having issues with sciatica.  She has had calf cramping intermittently.  Patient also states that she has had some "esophageal spasm".  No known relieving factors.   PMH, Surgical Hx, Family Hx, Social History reviewed and updated as below.  Past Medical History:  Diagnosis Date  . Hypothyroidism   . Prediabetes    Patient Active Problem List   Diagnosis Date Noted  . Vulvar lesion 03/23/2017  . Epidermal inclusion cyst 03/23/2017   Past Surgical History:  Procedure Laterality Date  . FOOT SURGERY      OB History    Gravida  4   Para  4   Term  4   Preterm      AB      Living  4     SAB      TAB      Ectopic      Multiple      Live Births  4           Home Medications    Prior to Admission medications   Medication Sig Start Date End Date Taking? Authorizing Provider  NATURE-THROID 97.5 MG TABS  03/08/17  Yes [provider]    Family History Family History  Problem Relation Age of Onset  . Glaucoma Mother   . Diabetes Mellitus II Paternal Grandfather   . Hypertension Father     Social History Social History   Tobacco Use  . Smoking status: Never Smoker  . Smokeless tobacco: Never Used  Substance Use Topics  . Alcohol use: Yes   Comment: rare  . Drug use: No     Allergies   Patient has no known allergies.   Review of Systems Review of Systems Per HPI  Physical Exam Triage Vital Signs ED Triage Vitals  Enc Vitals Group     BP 07/30/18 1003 (!) 141/70     Pulse Rate 07/30/18 1003 (!) 58     Resp 07/30/18 1003 18     Temp 07/30/18 1003 98.3 F (36.8 C)     Temp Source 07/30/18 1003 Oral     SpO2 07/30/18 1003 100 %     Weight 07/30/18 1001 230 lb (104.3 kg)     Height 07/30/18 1001 5' 7.75" (1.721 m)     Head Circumference --      Peak Flow --      Pain Score 07/30/18 1001 1     Pain Loc --      Pain Edu? --      Excl. in GC? --    Updated Vital Signs BP (!) 141/70 (BP Location: Right Arm)   Pulse (!) 58   Temp 98.3  F (36.8 C) (Oral)   Resp 18   Ht 5' 7.75" (1.721 m)   Wt 104.3 kg   LMP 07/21/2018   SpO2 100%   BMI 35.23 kg/m   Visual Acuity Right Eye Distance:   Left Eye Distance:   Bilateral Distance:    Right Eye Near:   Left Eye Near:    Bilateral Near:     Physical Exam Vitals signs and nursing note reviewed.  Constitutional:      General: She is not in acute distress.    Appearance: Normal appearance.  HENT:     Head: Normocephalic and atraumatic.     Nose: Nose normal. No rhinorrhea.  Eyes:     General:        Right eye: No discharge.        Left eye: No discharge.     Conjunctiva/sclera: Conjunctivae normal.  Cardiovascular:     Rate and Rhythm: Normal rate and regular rhythm.  Pulmonary:     Effort: Pulmonary effort is normal.     Breath sounds: No wheezing, rhonchi or rales.  Abdominal:     General: There is no distension.     Palpations: Abdomen is soft.     Tenderness: There is no abdominal tenderness.  Neurological:     General: No focal deficit present.     Mental Status: She is alert and oriented to person, place, and time.  Psychiatric:        Mood and Affect: Mood normal.        Behavior: Behavior normal.    UC Treatments / Results   Labs (all labs ordered are listed, but only abnormal results are displayed) Labs Reviewed - No data to display  EKG None  Radiology No results found.  Procedures Procedures (including critical care time)  Medications Ordered in UC Medications - No data to display  Initial Impression / Assessment and Plan / UC Course  I have reviewed the triage vital signs and the nursing notes.  Pertinent labs & imaging results that were available during my care of the patient were reviewed by me and considered in my medical decision making (see chart for details).    48 year old female presents with multiple somatic complaints.  She is concerned about gallbladder disease.  Arranging right upper quadrant ultrasound.  Final Clinical Impressions(s) / UC Diagnoses   Final diagnoses:  RUQ pain     Discharge Instructions     We will call with the results.  Take care  Dr. Adriana Simasook    ED Prescriptions    None     Controlled Substance Prescriptions [place] Controlled Substance Registry consulted? Not Applicable   Tommie SamsCook, Rebbecca Osuna G, DO 07/30/18 1100

## 2018-07-30 NOTE — ED Triage Notes (Signed)
Patient complains of back pain sensation in upper back, cramping in upper right abdomen. Patient states that she thinks she is having gallbladder symptoms. States that this has been ongoing since Christmas. Patient states that she feels like a pressure in upper abdomen.

## 2018-07-30 NOTE — Discharge Instructions (Signed)
We will call with the results.  Take care  Dr. Tahliyah Anagnos  

## 2018-08-01 DIAGNOSIS — R300 Dysuria: Secondary | ICD-10-CM | POA: Diagnosis not present

## 2018-08-02 ENCOUNTER — Other Ambulatory Visit: Payer: Self-pay

## 2018-08-02 ENCOUNTER — Ambulatory Visit
Admission: EM | Admit: 2018-08-02 | Discharge: 2018-08-02 | Disposition: A | Discharge status: Home / Self Care | Payer: BLUE CROSS/BLUE SHIELD | Attending: Family Medicine | Admitting: Family Medicine

## 2018-08-02 DIAGNOSIS — M546 Pain in thoracic spine: Secondary | ICD-10-CM

## 2018-08-02 LAB — URINALYSIS, COMPLETE (UACMP) WITH MICROSCOPIC
Bacteria, UA: NONE SEEN
Bilirubin Urine: NEGATIVE
GLUCOSE, UA: NEGATIVE mg/dL
HGB URINE DIPSTICK: NEGATIVE
Ketones, ur: NEGATIVE mg/dL
Leukocytes, UA: NEGATIVE
Nitrite: NEGATIVE
PH: 6 (ref 5.0–8.0)
Protein, ur: NEGATIVE mg/dL
RBC / HPF: NONE SEEN RBC/hpf (ref 0–5)
WBC, UA: NONE SEEN WBC/hpf (ref 0–5)

## 2018-08-02 NOTE — ED Provider Notes (Signed)
[facility]    CSN: [id number] Arrival date & time: 08/02/18  1028  History   Chief Complaint Chief Complaint  Patient presents with  . Flank Pain    APPT   HPI  48 year old female presents with above complaint.  Patient was recently seen on Tuesday.  She presented with multiple complaints.  Right upper quadrant ultrasound was obtained and revealed gallbladder sludge.  Patient continues to be symptomatic.  She reports ongoing "flank pain".  What she is actually referring to his back pain.  She states that it feels like she has been "punched".  She continues to complain of lower extremity cramping.  She reports ongoing nausea.  Patient states she has a referral in place regarding the recent ultrasound which revealed gallbladder sludge.  Patient states that she recently had a urinalysis done given her discomfort.  She was told that she had blood in her urine based on the dipstick.  Patient believes that she may have a "kidney infection".  Denies urinary symptoms.  Her pain seems to be worse with certain activities/movements.  No relieving factors.  No medications tried.    Past Medical History:  Diagnosis Date  . Hypothyroidism   . Prediabetes     Patient Active Problem List   Diagnosis Date Noted  . Vulvar lesion 03/23/2017  . Epidermal inclusion cyst 03/23/2017    Past Surgical History:  Procedure Laterality Date  . ADENOIDECTOMY    . FOOT SURGERY      OB History    Gravida  4   Para  4   Term  4   Preterm      AB      Living  4     SAB      TAB      Ectopic      Multiple      Live Births  4            Home Medications    Prior to Admission medications   Medication Sig Start Date End Date Taking? Authorizing Provider  NATURE-THROID 97.5 MG TABS  03/08/17   [provider]    Family History Family History  Problem Relation Age of Onset  . Glaucoma Mother   . Diabetes Mellitus II Paternal Grandfather   . Hypertension  Father     Social History Social History   Tobacco Use  . Smoking status: Never Smoker  . Smokeless tobacco: Never Used  Substance Use Topics  . Alcohol use: Yes    Comment: rare  . Drug use: No     Allergies   Patient has no known allergies.   Review of Systems Review of Systems Per HPI  Physical Exam Triage Vital Signs ED Triage Vitals  Enc Vitals Group     BP 08/02/18 1039 136/75     Pulse Rate 08/02/18 1039 71     Resp 08/02/18 1039 18     Temp 08/02/18 1039 98.6 F (37 C)     Temp Source 08/02/18 1039 Oral     SpO2 08/02/18 1039 100 %     Weight 08/02/18 1040 230 lb (104.3 kg)     Height 08/02/18 1040 5' 7.75" (1.721 m)     Head Circumference --      Peak Flow --      Pain Score 08/02/18 1040 7     Pain Loc --      Pain Edu? --      Excl. in GC? --  Updated Vital Signs BP 136/75 (BP Location: Left Arm)   Pulse 71   Temp 98.6 F (37 C) (Oral)   Resp 18   Ht 5' 7.75" (1.721 m)   Wt 104.3 kg   LMP 07/21/2018   SpO2 100%   BMI 35.23 kg/m   Visual Acuity Right Eye Distance:   Left Eye Distance:   Bilateral Distance:    Right Eye Near:   Left Eye Near:    Bilateral Near:     Physical Exam Vitals signs and nursing note reviewed.  Constitutional:      General: She is not in acute distress.    Appearance: Normal appearance.  HENT:     Head: Normocephalic and atraumatic.  Eyes:     General: No scleral icterus.    Conjunctiva/sclera: Conjunctivae normal.  Cardiovascular:     Rate and Rhythm: Normal rate and regular rhythm.  Pulmonary:     Effort: Pulmonary effort is normal.     Breath sounds: Normal breath sounds.  Abdominal:     General: There is no distension.     Palpations: Abdomen is soft.     Tenderness: There is no abdominal tenderness.  Musculoskeletal:     Comments: Thoracic spine -patient with mild tenderness of the paraspinal musculature of the mid to lower thoracic spine.  Patient has tight trapezius muscles.    Neurological:     Mental Status: She is alert.  Psychiatric:        Behavior: Behavior normal.     Comments: Perseverative.    UC Treatments / Results  Labs (all labs ordered are listed, but only abnormal results are displayed) Labs Reviewed  URINALYSIS, COMPLETE (UACMP) WITH MICROSCOPIC - Abnormal; Notable for the following components:      Result Value   Color, Urine STRAW (*)    Specific Gravity, Urine <1.005 (*)    All other components within normal limits    EKG None  Radiology No results found.  Procedures Procedures (including critical care time)  Medications Ordered in UC Medications - No data to display  Initial Impression / Assessment and Plan / UC Course  I have reviewed the triage vital signs and the nursing notes.  Pertinent labs & imaging results that were available during my care of the patient were reviewed by me and considered in my medical decision making (see chart for details).    48 year old female presents with appears to be musculoskeletal pain.  Urinalysis normal.  Patient referral in place regarding her recent ultrasound results.  Urged anti-inflammatory medication for her discomfort.  Patient declined.  Patient states that she will take over-the-counter Advil.  Advised heat.  Rest.  Supportive care.  Final Clinical Impressions(s) / UC Diagnoses   Final diagnoses:  Bilateral thoracic back pain, unspecified chronicity     Discharge Instructions     Heat.  Try OTC Advil.  See the specialist.  Take care  Dr. Adriana Simasook    ED Prescriptions    None     Controlled Substance Prescriptions [place] Controlled Substance Registry consulted? Not Applicable   Tommie SamsCook, Winnona Wargo G, DO 08/02/18 1241

## 2018-08-02 NOTE — ED Triage Notes (Signed)
Pt had gallbladder US done on Tuesday but still having flank pain. Concerned she has a kidney infection. Pt had urine dipstick done yesterday which showed some blood. Awaiting urine culture

## 2018-08-02 NOTE — Discharge Instructions (Signed)
Heat.  Try OTC Advil.  See the specialist.  Take care  Dr. Adriana Simas

## 2018-08-05 ENCOUNTER — Inpatient Hospital Stay: Admission: RE | Admit: 2018-08-05 | Payer: BLUE CROSS/BLUE SHIELD | Source: Ambulatory Visit

## 2018-08-06 ENCOUNTER — Ambulatory Visit: Payer: BLUE CROSS/BLUE SHIELD | Admitting: Surgery

## 2018-08-15 ENCOUNTER — Encounter: Payer: Self-pay | Admitting: Surgery

## 2018-08-15 ENCOUNTER — Ambulatory Visit (INDEPENDENT_AMBULATORY_CARE_PROVIDER_SITE_OTHER): Payer: BLUE CROSS/BLUE SHIELD | Admitting: Surgery

## 2018-08-15 ENCOUNTER — Other Ambulatory Visit: Payer: Self-pay

## 2018-08-15 VITALS — BP 130/85 | HR 59 | Temp 97.7°F | Resp 18 | Ht 68.0 in | Wt 237.8 lb

## 2018-08-15 DIAGNOSIS — K8020 Calculus of gallbladder without cholecystitis without obstruction: Secondary | ICD-10-CM

## 2018-08-15 DIAGNOSIS — F988 Other specified behavioral and emotional disorders with onset usually occurring in childhood and adolescence: Secondary | ICD-10-CM | POA: Insufficient documentation

## 2018-08-15 DIAGNOSIS — E538 Deficiency of other specified B group vitamins: Secondary | ICD-10-CM | POA: Insufficient documentation

## 2018-08-15 DIAGNOSIS — E669 Obesity, unspecified: Secondary | ICD-10-CM | POA: Insufficient documentation

## 2018-08-15 DIAGNOSIS — E559 Vitamin D deficiency, unspecified: Secondary | ICD-10-CM | POA: Insufficient documentation

## 2018-08-15 DIAGNOSIS — K838 Other specified diseases of biliary tract: Secondary | ICD-10-CM

## 2018-08-15 DIAGNOSIS — I83893 Varicose veins of bilateral lower extremities with other complications: Secondary | ICD-10-CM | POA: Insufficient documentation

## 2018-08-15 DIAGNOSIS — R7303 Prediabetes: Secondary | ICD-10-CM | POA: Insufficient documentation

## 2018-08-15 NOTE — Progress Notes (Signed)
Surgical Clinic History and Physical  Referring provider:  No referring provider defined for this encounter.  HISTORY OF PRESENT ILLNESS (HPI):  48 y.o. female presents for evaluation of abdominal pain. Patient reports RUQ pain that has radiated to her Right back. She says she thought it could be kidney stones, but no blood has been present on multiple urine analyses. She's experienced such pain x multiple prior episodes, including 2016 and 08/02/2018, for which she presented to [facility]. She otherwise reports +flatus and BM's WNL, denies fever/chills, CP, or SOB.  PAST MEDICAL HISTORY (PMH):  Past Medical History:  Diagnosis Date  . Hypothyroidism   . Prediabetes     PAST SURGICAL HISTORY (PSH):  Past Surgical History:  Procedure Laterality Date  . ADENOIDECTOMY    . FOOT SURGERY      MEDICATIONS:  Prior to Admission medications   Medication Sig Start Date End Date Taking? Authorizing Provider  NATURE-THROID 97.5 MG TABS  03/08/17  Yes [provider]    ALLERGIES:  No Known Allergies   SOCIAL HISTORY:  Social History   Socioeconomic History  . Marital status: Married    Spouse name: Not on file  . Number of children: Not on file  . Years of education: Not on file  . Highest education level: Not on file  Occupational History  . Not on file  Social Needs  . Financial resource strain: Not on file  . Food insecurity:    Worry: Not on file    Inability: Not on file  . Transportation needs:    Medical: Not on file    Non-medical: Not on file  Tobacco Use  . Smoking status: Never Smoker  . Smokeless tobacco: Never Used  Substance and Sexual Activity  . Alcohol use: Yes    Comment: rare  . Drug use: No  . Sexual activity: Yes    Birth control/protection: None  Lifestyle  . Physical activity:    Days per week: Not on file    Minutes per session: Not on file  . Stress: Not on file  Relationships  . Social connections:    Talks on phone: Not  on file    Gets together: Not on file    Attends religious service: Not on file    Active member of club or organization: Not on file    Attends meetings of clubs or organizations: Not on file    Relationship status: Not on file  . Intimate partner violence:    Fear of current or ex partner: Not on file    Emotionally abused: Not on file    Physically abused: Not on file    Forced sexual activity: Not on file  Other Topics Concern  . Not on file  Social History Narrative  . Not on file    The patient currently resides (home / rehab facility / nursing home): Home The patient normally is (ambulatory / bedbound): Ambulatory  FAMILY HISTORY:  Family History  Problem Relation Age of Onset  . Glaucoma Mother   . Diabetes Mellitus II Paternal Grandfather   . Hypertension Father     Otherwise negative/non-contributory.  REVIEW OF SYSTEMS:  Constitutional: denies any other weight loss, fever, chills, or sweats  Eyes: denies any other vision changes, history of eye injury  ENT: denies sore throat, hearing problems  Respiratory: denies shortness of breath, wheezing  Cardiovascular: denies chest pain, palpitations  Gastrointestinal: abdominal pain, N/V, and bowel function as per HPI Musculoskeletal:  denies any other joint pains or cramps  Skin: Denies any other rashes or skin discolorations Neurological: denies any other headache, dizziness, weakness  Psychiatric: Denies any other depression, anxiety   All other review of systems were otherwise negative   VITAL SIGNS:  BP 130/85   Pulse (!) 59   Temp 97.7 F (36.5 C) (Temporal)   Resp 18   Ht 5\' 8"  (1.727 m)   Wt 237 lb 12.8 oz (107.9 kg)   LMP 08/12/2018   SpO2 98%   BMI 36.16 kg/m   PHYSICAL EXAM:  Constitutional:  -- Overweight body habitus  -- Awake, alert, and oriented x3  Eyes:  -- Pupils equally round and reactive to light  -- No scleral icterus  Ear, nose, throat:  -- No jugular venous distension -- No  nasal drainage, bleeding Pulmonary:  -- No crackles  -- Equal breath sounds bilaterally -- Breathing non-labored at rest Cardiovascular:  -- S1, S2 present  -- No pericardial rubs  Gastrointestinal:  -- Abdomen soft, non-tender to palpation, non-distended, no guarding/rebound tenderness -- No abdominal masses appreciated, pulsatile or otherwise  Musculoskeletal and Integumentary:  -- Wounds or skin discoloration: None appreciated -- Extremities: B/L UE and LE FROM, hands and feet warm  Neurologic:  -- Motor function: Intact and symmetric -- Sensation: Intact and symmetric  Labs:  CBC: No results found for: WBC, RBC CMP Latest Ref Rng & Units 02/11/2018  Glucose 70 - 99 mg/dL 161(W112(H)  BUN 6 - 20 mg/dL 14  Creatinine 9.600.44 - 4.541.00 mg/dL 0.980.69  Sodium 119135 - 147145 mmol/L 138  Potassium 3.5 - 5.1 mmol/L 4.1  Chloride 98 - 111 mmol/L 105  CO2 22 - 32 mmol/L 23  Calcium 8.9 - 10.3 mg/dL 9.0  Total Protein 6.5 - 8.1 g/dL 8.0  Total Bilirubin 0.3 - 1.2 mg/dL 0.4  Alkaline Phos 38 - 126 U/L 69  AST 15 - 41 U/L 23  ALT 0 - 44 U/L 19    Imaging studies:  Limited RUQ Abdominal Ultrasound (07/30/2018) Small gallbladder sludge. Incidentally visualized Right renal cyst, not significantly changed since prior imaging study. No acute findings.   Assessment/Plan: 48 y.o. female with possible symptomatic microcholelithiasis (biliary "sludge"), complicated by pertinent comorbidities including obesity (BMI >36), prediabetes mellitus, and hypothyroidism.   - keep a log of foods (if any) after which pain occurs             - if pain after fattier foods, avoid/minimize foods with higher fat content (meats, cheeses/dairy, and fried)             - likewise, prefer low-fat vegetables, whole grains (wheat bread, ceareals, etc), and fruits until cholecystectomy if as above             - all risks, benefits, and alternatives to cholecystectomy were discussed with the patient, all of her questions were answered  to patient's expressed satisfaction, patient expresses she wishes to wait and see if her pain occurs again now that she knows which foods can contribute.             - however, patient also inquires whether gallstones / biliary "sludge" can resolve on its own, with more Vitamin C or exercise             - routine follow-up appointment offered, but patient expresses preference to instead follow up as needed             - instructed to call if any questions  or concerns  All of the above recommendations were discussed with the patient, and all of patient's questions were answered to her expressed satisfaction.  Thank you for the opportunity to participate in this patient's care.  -- Scherrie GerlachJason E. Earlene Plateravis, MD, RPVI [place]: [facility] - Partnering for exceptional care. Office: [phone]

## 2018-08-15 NOTE — Patient Instructions (Addendum)

## 2018-08-16 DIAGNOSIS — R7309 Other abnormal glucose: Secondary | ICD-10-CM | POA: Diagnosis not present

## 2018-08-16 DIAGNOSIS — E538 Deficiency of other specified B group vitamins: Secondary | ICD-10-CM | POA: Diagnosis not present

## 2018-08-16 DIAGNOSIS — R5383 Other fatigue: Secondary | ICD-10-CM | POA: Diagnosis not present

## 2018-08-16 DIAGNOSIS — D508 Other iron deficiency anemias: Secondary | ICD-10-CM | POA: Diagnosis not present

## 2018-08-16 DIAGNOSIS — R7982 Elevated C-reactive protein (CRP): Secondary | ICD-10-CM | POA: Diagnosis not present

## 2018-08-16 DIAGNOSIS — E063 Autoimmune thyroiditis: Secondary | ICD-10-CM | POA: Diagnosis not present

## 2018-08-16 DIAGNOSIS — E559 Vitamin D deficiency, unspecified: Secondary | ICD-10-CM | POA: Diagnosis not present

## 2018-08-25 ENCOUNTER — Encounter: Payer: Self-pay | Admitting: Surgery

## 2018-08-25 DIAGNOSIS — K838 Other specified diseases of biliary tract: Secondary | ICD-10-CM | POA: Insufficient documentation

## 2018-08-27 ENCOUNTER — Other Ambulatory Visit: Payer: Self-pay

## 2018-08-27 ENCOUNTER — Ambulatory Visit (INDEPENDENT_AMBULATORY_CARE_PROVIDER_SITE_OTHER): Payer: BLUE CROSS/BLUE SHIELD | Admitting: Gastroenterology

## 2018-08-27 ENCOUNTER — Encounter: Payer: Self-pay | Admitting: Gastroenterology

## 2018-08-27 VITALS — BP 153/84 | HR 92 | Ht 68.0 in | Wt 240.8 lb

## 2018-08-27 DIAGNOSIS — R1013 Epigastric pain: Secondary | ICD-10-CM | POA: Diagnosis not present

## 2018-08-27 DIAGNOSIS — Z1211 Encounter for screening for malignant neoplasm of colon: Secondary | ICD-10-CM

## 2018-08-27 DIAGNOSIS — R110 Nausea: Secondary | ICD-10-CM | POA: Diagnosis not present

## 2018-08-28 ENCOUNTER — Encounter: Payer: Self-pay | Admitting: Registered"

## 2018-08-28 NOTE — Progress Notes (Signed)
Teresa Peters [address]  [address]  [place], [place] [address]  Main: [phone]  Fax: [phone]   Gastroenterology Consultation  Referring Provider:     Iva Lento Primary Care Physician:  Steele Sizer, MD Reason for Consultation:   Abdominal pain, nausea        HPI:    Chief Complaint  Patient presents with  . New Patient (Initial Visit)    ref. by Lurlean Leyden for significant pain with nausea. Pt c/o esophageal spasms and constipation    Teresa Peters is a 48 y.o. y/o female referred for consultation & management  by Dr. Dossie Arbour, Redge Gainer, MD.  Patient reports that she previously had right upper quadrant midepigastric abdominal pain with nausea and this is completely resolved.  She has recently seen Dr. Earlene Plater for this, and she states she is not interested in cholecystectomy at this time.  She is eating well.  No nausea vomiting or weight loss the last few weeks.  No dysphagia.  Does report intermittent heartburn.  Right upper quadrant ultrasound showed small gallbladder sludge.  No acute findings otherwise.  Liver within normal limits.  Patient denies any previous history of colonoscopy.  However states she had an upper endoscopy with Dr. Marva Panda years ago.  Report not available in epic.  States polyps were seen in her stomach but she has no follow-up after that.  Past Medical History:  Diagnosis Date  . Hypothyroidism   . Prediabetes     Past Surgical History:  Procedure Laterality Date  . ADENOIDECTOMY    . ESOPHAGOGASTRODUODENOSCOPY  2012   polyps (pt states they were not removed)  . FOOT SURGERY      Prior to Admission medications   Medication Sig Start Date End Date Taking? Authorizing Provider  NATURE-THROID 97.5 MG TABS  03/08/17  Yes [provider]    Family History  Problem Relation Age of Onset  . Glaucoma Mother   . Diabetes Mellitus II Paternal Grandfather   . Hypertension Father      Social History    Tobacco Use  . Smoking status: Never Smoker  . Smokeless tobacco: Never Used  Substance Use Topics  . Alcohol use: Yes    Comment: rare  . Drug use: No    Allergies as of 08/27/2018  . (No Known Allergies)    Review of Systems:    All systems reviewed and negative except where noted in HPI.   Physical Exam:  BP (!) 153/84   Pulse 92   Ht 5\' 8"  (1.727 m)   Wt 240 lb 12.8 oz (109.2 kg)   LMP 08/12/2018   BMI 36.61 kg/m  Patient's last menstrual period was 08/12/2018. Psych:  Alert and cooperative. Normal mood and affect. General:   Alert,  Well-developed, well-nourished, pleasant and cooperative in NAD Head:  Normocephalic and atraumatic. Eyes:  Sclera clear, no icterus.   Conjunctiva pink. Ears:  Normal auditory acuity. Nose:  No deformity, discharge, or lesions. Mouth:  No deformity or lesions,oropharynx pink & moist. Neck:  Supple; no masses or thyromegaly. Abdomen:  Normal bowel sounds.  No bruits.  Soft, non-tender and non-distended without masses, hepatosplenomegaly or hernias noted.  No guarding or rebound tenderness.    Msk:  Symmetrical without gross deformities. Good, equal movement & strength bilaterally. Pulses:  Normal pulses noted. Extremities:  No clubbing or edema.  No cyanosis. Neurologic:  Alert and oriented x3;  grossly normal neurologically. Skin:  Intact without  significant lesions or rashes. No jaundice. Lymph Nodes:  No significant cervical adenopathy. Psych:  Alert and cooperative. Normal mood and affect.   Labs: Lab records from January February 2020 reviewed done by patient's nutritionist.  Normal CBC, normal ferritin and iron panel, normal CMP.  Imaging Studies: US Abdomen Limited Ruq  Result Date: 07/30/2018 CLINICAL DATA:  Right upper quadrant pain radiating to the back for the past few months. EXAM: ULTRASOUND ABDOMEN LIMITED RIGHT UPPER QUADRANT COMPARISON:  Abdominal ultrasound dated March 15, 2017. FINDINGS: Gallbladder: Small  amount of gallbladder sludge. No gallstones or wall thickening visualized. No sonographic Murphy sign noted by sonographer. Common bile duct: Diameter: 4 mm, normal. Liver: No focal lesion identified. Within normal limits in parenchymal echogenicity. Portal vein is patent on color Doppler imaging with normal direction of blood flow towards the liver. Right renal cyst incidentally visualized, not significantly changed. IMPRESSION: 1. Small gallbladder sludge.  No acute findings. Electronically Signed   By: Obie Dredge M.D.   On: 07/30/2018 11:28    Assessment and Plan:   Teresa Peters is a 48 y.o. y/o female has been referred for abdominal pain  Patient abdominal pain has resolved as of the last few weeks However, given her previous history of polyps, and patient also specifically states that "I am a hypochondriac", she is worried about her previous history of polyps, and abdominal pain that occurred a few weeks ago.  She is interested in an upper endoscopy to evaluate any underlying lesions.  We discussed risks and benefits of upper endoscopy in detail and she would like to proceed with the procedure at this time.  She is also due for screening colonoscopy and is agreeable to proceeding with both EGD and colonoscopy at this time  Lab work otherwise reassuring done recently and sent for scanning.  I have discussed alternative options, risks & benefits,  which include, but are not limited to, bleeding, infection, perforation,respiratory complication & drug reaction.  The patient agrees with this plan & written consent will be obtained.      Dr Teresa Peters  Speech recognition software was used to dictate the above note.

## 2018-09-05 DIAGNOSIS — R7982 Elevated C-reactive protein (CRP): Secondary | ICD-10-CM | POA: Diagnosis not present

## 2018-09-05 DIAGNOSIS — R5383 Other fatigue: Secondary | ICD-10-CM | POA: Diagnosis not present

## 2018-09-05 DIAGNOSIS — E119 Type 2 diabetes mellitus without complications: Secondary | ICD-10-CM | POA: Diagnosis not present

## 2018-09-05 DIAGNOSIS — F430 Acute stress reaction: Secondary | ICD-10-CM | POA: Diagnosis not present

## 2018-09-17 ENCOUNTER — Encounter: Payer: Self-pay | Admitting: *Deleted

## 2018-09-17 ENCOUNTER — Encounter: Payer: Self-pay | Admitting: Anesthesiology

## 2018-09-17 ENCOUNTER — Other Ambulatory Visit: Payer: Self-pay

## 2018-09-18 ENCOUNTER — Telehealth: Payer: Self-pay | Admitting: Gastroenterology

## 2018-09-18 NOTE — Telephone Encounter (Signed)
Pt left vm to perhaps r/s her procedure for 09/24/18 please call pt

## 2018-09-18 NOTE — Telephone Encounter (Signed)
Patient contacted office to cancel her colon/egd scheduled with Dr. Maximino Greenland on 09/24/18 at [facility].  She states she will call back to reschedule.  Thanks Marcelino Duster  No Fee  Canceled Due to COVID 19

## 2018-09-24 ENCOUNTER — Ambulatory Visit
Admission: RE | Admit: 2018-09-24 | Payer: BLUE CROSS/BLUE SHIELD | Source: Home / Self Care | Admitting: Gastroenterology

## 2018-09-24 HISTORY — DX: Fatty (change of) liver, not elsewhere classified: K76.0

## 2018-09-24 SURGERY — COLONOSCOPY WITH PROPOFOL
Anesthesia: Choice

## 2018-10-07 ENCOUNTER — Other Ambulatory Visit: Payer: Self-pay

## 2018-10-07 DIAGNOSIS — Z1211 Encounter for screening for malignant neoplasm of colon: Secondary | ICD-10-CM

## 2018-10-07 DIAGNOSIS — R110 Nausea: Secondary | ICD-10-CM

## 2018-10-07 DIAGNOSIS — R1013 Epigastric pain: Secondary | ICD-10-CM

## 2018-10-08 ENCOUNTER — Ambulatory Visit: Payer: BLUE CROSS/BLUE SHIELD | Admitting: Gastroenterology

## 2018-11-04 ENCOUNTER — Telehealth: Payer: Self-pay

## 2018-11-04 NOTE — Telephone Encounter (Signed)
Pt called triage line stating she had a sore come up on her Right breast. She states it has went away but is concerned about how it may have affected the surrounding areas. She is wanting a breast exam and also a mammogram.

## 2018-11-05 NOTE — Telephone Encounter (Signed)
Sarah please schedule with SDJ, at patient's convenience

## 2018-11-05 NOTE — Telephone Encounter (Signed)
Called and left voice mail for patient to call back to be schedule °

## 2018-11-05 NOTE — Telephone Encounter (Signed)
OK to schedule in-office exam. We can order mammogram from there.

## 2018-11-06 ENCOUNTER — Ambulatory Visit (INDEPENDENT_AMBULATORY_CARE_PROVIDER_SITE_OTHER): Payer: BLUE CROSS/BLUE SHIELD | Admitting: Obstetrics and Gynecology

## 2018-11-06 ENCOUNTER — Other Ambulatory Visit: Payer: Self-pay

## 2018-11-06 ENCOUNTER — Encounter: Payer: Self-pay | Admitting: Obstetrics and Gynecology

## 2018-11-06 VITALS — BP 122/84 | Ht 66.0 in | Wt 242.0 lb

## 2018-11-06 DIAGNOSIS — N649 Disorder of breast, unspecified: Secondary | ICD-10-CM | POA: Diagnosis not present

## 2018-11-06 DIAGNOSIS — L988 Other specified disorders of the skin and subcutaneous tissue: Secondary | ICD-10-CM

## 2018-11-06 NOTE — Progress Notes (Signed)
Obstetrics & Gynecology Office Visit   Chief Complaint  Patient presents with  . Breast Pain   History of Present Illness: 48 y.o. G21P4004 female who presents with a sore spot on her left breast that is just below the areola.  She first noted that there was a sore spot there about 5 days ago.  She was able to feel the spot 6 days ago.  She describes the area as "almost blistered."  There is also some redness around the area.  She has been treating the area with [place] honey and has noticed a slight improvement in the redness.  She states that under the surface of the lesion there is some firmness that is about the size of a half dollar.  She believes this is gotten a little smaller with her home remedy.  She also notes that maybe there has been some bleeding under the surface.  She denies any trauma or insect bites.  She has never had a mammogram.  She denies leakage of blood or discharge from her nipples.    Past Medical History:  Diagnosis Date  . Fatty liver   . Hypothyroidism   . Prediabetes     Past Surgical History:  Procedure Laterality Date  . ADENOIDECTOMY    . ESOPHAGOGASTRODUODENOSCOPY  2012   polyps (pt states they were not removed)  . FOOT SURGERY      Gynecologic History: Patient's last menstrual period was 11/03/2018.  Obstetric History: [id number]  Family History  Problem Relation Age of Onset  . Glaucoma Mother   . Diabetes Mellitus II Paternal Grandfather   . Hypertension Father     Social History   Socioeconomic History  . Marital status: Married    Spouse name: Not on file  . Number of children: Not on file  . Years of education: Not on file  . Highest education level: Not on file  Occupational History  . Not on file  Social Needs  . Financial resource strain: Not on file  . Food insecurity:    Worry: Not on file    Inability: Not on file  . Transportation needs:    Medical: Not on file    Non-medical: Not on file  Tobacco Use  . Smoking  status: Never Smoker  . Smokeless tobacco: Never Used  Substance and Sexual Activity  . Alcohol use: Yes    Comment: rare - 1x/month  . Drug use: No  . Sexual activity: Yes    Birth control/protection: None  Lifestyle  . Physical activity:    Days per week: Not on file    Minutes per session: Not on file  . Stress: Not on file  Relationships  . Social connections:    Talks on phone: Not on file    Gets together: Not on file    Attends religious service: Not on file    Active member of club or organization: Not on file    Attends meetings of clubs or organizations: Not on file    Relationship status: Not on file  . Intimate partner violence:    Fear of current or ex partner: Not on file    Emotionally abused: Not on file    Physically abused: Not on file    Forced sexual activity: Not on file  Other Topics Concern  . Not on file  Social History Narrative  . Not on file    No Known Allergies  Prior to Admission medications   Medication Sig Start  Date End Date Taking? Authorizing Provider  B Complex Vitamins (VITAMIN B COMPLEX PO) Take by mouth daily.    [provider]  Ferrous Sulfate (IRON PO) Take by mouth daily.    [provider]  Menaquinone-7 (VITAMIN K2 PO) Take by mouth daily.    [provider]  thyroid (ARMOUR) 90 MG tablet Take 90 mg by mouth daily.    [provider]  VITAMIN D PO Take by mouth daily.    [provider]    Review of Systems  Constitutional: Negative.   HENT: Negative.   Eyes: Negative.   Respiratory: Negative.   Cardiovascular: Negative.   Gastrointestinal: Negative.   Genitourinary: Negative.   Musculoskeletal: Negative.   Skin:       See HPI  Neurological: Negative.   Psychiatric/Behavioral: Negative.      Physical Exam BP 122/84   Ht 5\' 6"  (1.676 m)   Wt 242 lb (109.8 kg)   LMP 11/03/2018   BMI 39.06 kg/m  Patient's last menstrual period was 11/03/2018. Physical Exam  Constitutional:      General: She is not in acute distress.    Appearance: Normal appearance.  HENT:     Head: Normocephalic and atraumatic.  Eyes:     General: No scleral icterus.    Conjunctiva/sclera: Conjunctivae normal.  Chest:     Breasts:        Right: Normal.        Left: Skin change present. No bleeding, mass, nipple discharge or tenderness.    Lymphadenopathy:     Upper Body:     Right upper body: No supraclavicular or axillary adenopathy.     Left upper body: No supraclavicular, axillary or pectoral adenopathy.  Neurological:     General: No focal deficit present.     Mental Status: She is alert and oriented to person, place, and time.     Cranial Nerves: No cranial nerve deficit.  Psychiatric:        Mood and Affect: Mood normal.        Behavior: Behavior normal.        Judgment: Judgment normal.     Female chaperone present for pelvic and breast  portions of the physical exam  Assessment: 48 y.o. [id number] female here for  1. Lesion of skin of breast      Plan: Problem List Items Addressed This Visit    None    Visit Diagnoses    Lesion of skin of breast    -  Primary   Relevant Orders   MM DIAG BREAST TOMO BILATERAL   US BREAST LTD UNI RIGHT INC AXILLA   US BREAST LTD UNI LEFT INC AXILLA     The differential ranges from a superficial skin insult that has resulted in an ulceration.  She has no systemic symptoms to suggest infection.  There is a bit of induration versus an actual lesion below this level.  We discussed a primary skin etiology, such as skin cancer.  We also discussed the primary breast etiology such as inflammatory breast cancer versus other forms of breast cancer.  Further, we discussed non-neoplastic etiologies.  As she has never had a mammogram and given the above differential, I recommend a diagnostic mammogram with or without ultrasound.  In parallel I recommended a dermatologic work-up.  I strongly recommend she contact a dermatologist  when she has had a mammogram.  We discussed that I could biopsy the area.  We also discussed that  giving the area time to heal, if this is a insult to her skin may also be reasonable.  The amount of time to give her skin and underlying tissue to heal could be as short as a week and we could move from there.  At this point she is very concerned about the area and would like investigation as soon as possible.  Therefore, we will pursue the above recommended work-up.  20 minutes spent in face to face discussion with > 50% spent in counseling,management, and coordination of care of her left breast lesion.   Thomasene Mohair, MD 11/06/2018 6:08 PM

## 2018-11-12 DIAGNOSIS — L2389 Allergic contact dermatitis due to other agents: Secondary | ICD-10-CM | POA: Diagnosis not present

## 2018-11-12 DIAGNOSIS — L720 Epidermal cyst: Secondary | ICD-10-CM | POA: Diagnosis not present

## 2018-11-13 ENCOUNTER — Other Ambulatory Visit: Payer: BLUE CROSS/BLUE SHIELD

## 2018-12-12 DIAGNOSIS — L720 Epidermal cyst: Secondary | ICD-10-CM | POA: Diagnosis not present

## 2018-12-12 DIAGNOSIS — L738 Other specified follicular disorders: Secondary | ICD-10-CM | POA: Diagnosis not present

## 2018-12-18 ENCOUNTER — Other Ambulatory Visit: Payer: BLUE CROSS/BLUE SHIELD

## 2018-12-18 ENCOUNTER — Ambulatory Visit: Payer: BLUE CROSS/BLUE SHIELD

## 2018-12-27 ENCOUNTER — Ambulatory Visit
Admission: RE | Admit: 2018-12-27 | Discharge: 2018-12-27 | Disposition: A | Discharge status: Home / Self Care | Payer: BC Managed Care – PPO | Source: Ambulatory Visit | Attending: Obstetrics and Gynecology | Admitting: Obstetrics and Gynecology

## 2018-12-27 ENCOUNTER — Other Ambulatory Visit: Payer: Self-pay

## 2018-12-27 DIAGNOSIS — L988 Other specified disorders of the skin and subcutaneous tissue: Secondary | ICD-10-CM

## 2018-12-27 DIAGNOSIS — R928 Other abnormal and inconclusive findings on diagnostic imaging of breast: Secondary | ICD-10-CM | POA: Diagnosis not present

## 2018-12-27 DIAGNOSIS — N649 Disorder of breast, unspecified: Secondary | ICD-10-CM | POA: Insufficient documentation

## 2018-12-27 DIAGNOSIS — R922 Inconclusive mammogram: Secondary | ICD-10-CM | POA: Diagnosis not present

## 2018-12-27 DIAGNOSIS — N6489 Other specified disorders of breast: Secondary | ICD-10-CM | POA: Diagnosis not present

## 2019-01-06 ENCOUNTER — Encounter: Payer: Self-pay | Admitting: Family Medicine

## 2019-01-06 ENCOUNTER — Other Ambulatory Visit: Payer: Self-pay

## 2019-01-06 ENCOUNTER — Ambulatory Visit (INDEPENDENT_AMBULATORY_CARE_PROVIDER_SITE_OTHER): Payer: BC Managed Care – PPO | Admitting: Family Medicine

## 2019-01-06 VITALS — BP 127/83 | HR 71 | Temp 98.7°F | Ht 66.0 in | Wt 245.0 lb

## 2019-01-06 DIAGNOSIS — E538 Deficiency of other specified B group vitamins: Secondary | ICD-10-CM | POA: Diagnosis not present

## 2019-01-06 DIAGNOSIS — R7303 Prediabetes: Secondary | ICD-10-CM

## 2019-01-06 DIAGNOSIS — Z13220 Encounter for screening for lipoid disorders: Secondary | ICD-10-CM

## 2019-01-06 DIAGNOSIS — E039 Hypothyroidism, unspecified: Secondary | ICD-10-CM

## 2019-01-06 DIAGNOSIS — E559 Vitamin D deficiency, unspecified: Secondary | ICD-10-CM

## 2019-01-06 DIAGNOSIS — Z114 Encounter for screening for human immunodeficiency virus [HIV]: Secondary | ICD-10-CM

## 2019-01-06 DIAGNOSIS — R499 Unspecified voice and resonance disorder: Secondary | ICD-10-CM | POA: Insufficient documentation

## 2019-01-06 DIAGNOSIS — D649 Anemia, unspecified: Secondary | ICD-10-CM | POA: Diagnosis not present

## 2019-01-06 DIAGNOSIS — E6609 Other obesity due to excess calories: Secondary | ICD-10-CM

## 2019-01-06 DIAGNOSIS — I83893 Varicose veins of bilateral lower extremities with other complications: Secondary | ICD-10-CM

## 2019-01-06 NOTE — Assessment & Plan Note (Signed)
Checking labs today. Await results. Treat as needed.  

## 2019-01-06 NOTE — Assessment & Plan Note (Signed)
A1c in February 2020 was 6.3- rechecking labs today. Await results. Treat as needed. Continue to monitor.

## 2019-01-06 NOTE — Patient Instructions (Signed)
Peripheral Edema  Peripheral edema is swelling that is caused by a buildup of fluid. Peripheral edema most often affects the lower legs, ankles, and feet. It can also develop in the arms, hands, and face. The area of the body that has peripheral edema will look swollen. It may also feel heavy or warm. Your clothes may start to feel tight. Pressing on the area may make a temporary dent in your skin. You may not be able to move your swollen arm or leg as much as usual. There are many causes of peripheral edema. It can happen because of a complication of other conditions such as congestive heart failure, kidney disease, or a problem with your blood circulation. It also can be a side effect of certain medicines or because of an infection. It often happens to women during pregnancy. Sometimes, the cause is not known. Follow these instructions at home: Managing pain, stiffness, and swelling   Raise (elevate) your legs while you are sitting or lying down.  Move around often to prevent stiffness and to lessen swelling.  Do not sit or stand for long periods of time.  Wear support stockings as told by your health care provider. Medicines  Take over-the-counter and prescription medicines only as told by your health care provider.  Your health care provider may prescribe medicine to help your body get rid of excess water (diuretic). General instructions  Pay attention to any changes in your symptoms.  Follow instructions from your health care provider about limiting salt (sodium) in your diet. Sometimes, eating less salt may reduce swelling.  Moisturize skin daily to help prevent skin from cracking and draining.  Keep all follow-up visits as told by your health care provider. This is important. Contact a health care provider if you have:  A fever.  Edema that starts suddenly or is getting worse, especially if you are pregnant or have a medical condition.  Swelling in only one leg.  Increased  swelling, redness, or pain in one or both of your legs.  Drainage or sores at the area where you have edema. Get help right away if you:  Develop shortness of breath, especially when you are lying down.  Have pain in your chest or abdomen.  Feel weak.  Feel faint. Summary  Peripheral edema is swelling that is caused by a buildup of fluid. Peripheral edema most often affects the lower legs, ankles, and feet.  Move around often to prevent stiffness and to lessen swelling. Do not sit or stand for long periods of time.  Pay attention to any changes in your symptoms.  Contact a health care provider if you have edema that starts suddenly or is getting worse, especially if you are pregnant or have a medical condition.  Get help right away if you develop shortness of breath, especially when lying down. This information is not intended to replace advice given to you by your health care provider. Make sure you discuss any questions you have with your health care provider. Document Released: 07/27/2004 Document Revised: 03/13/2018 Document Reviewed: 03/13/2018 Elsevier Patient Education  2020 [employer]  How to Use Compression Stockings Compression stockings are elastic socks that squeeze the legs. They help increase blood flow (circulation) to the legs, decrease swelling in the legs, and reduce the chance of developing blood clots in the lower legs. Compression stockings are often used by people who:  Are recovering from surgery.  Have poor circulation in their legs.  Tend to get blood clots in  their legs.  Have bulging (varicose) veins.  Sit or stay in bed for long periods of time. Follow instructions from your health care provider about how and when to wear your compression stockings. How to wear compression stockings Before you put on your compression stockings:  Make sure that they are the correct size and degree of compression. If you do not know your size or required grade  of compression, ask your health care provider and follow the manufacturer's instructions that come with the stockings.  Make sure that they are clean, dry, and in good condition.  Check them for rips and tears. Do not put them on if they are ripped or torn. Put your stockings on first thing in the morning, before you get out of bed. Keep them on for as long as your health care provider advises. When you are wearing your stockings:  Keep them as smooth as possible. Do not allow them to bunch up. It is especially important to prevent the stockings from bunching up around your toes or behind your knees.  Do not roll the stockings downward and leave them rolled down. This can decrease blood flow to your leg.  Change them right away if they become wet or dirty. When you take off your stockings, inspect your legs and feet. Check for:  Open sores.  Red spots.  Swelling. General tips  Do not stop wearing compression stockings without talking to your health care provider first.  Wash your stockings every day with mild detergent in cold or warm water. Do not use bleach. Air-dry your stockings or dry them in a clothes dryer on low heat. It may be helpful to have two pairs so that you have a pair to wear while the other is being washed.  Replace your stockings every 3-6 months.  If skin moisturizing is part of your treatment plan, apply lotion or cream at night so that your skin will be dry when you put on the stockings in the morning. It is harder to put the stockings on when you have lotion on your legs or feet.  Wear nonskid shoes or slip-resistant socks when walking while wearing compression stockings. Contact a health care provider and remove your stockings if you have:  A feeling of pins and needles in your feet or legs.  Open sores, red spots, or other skin changes on your feet or legs.  Swelling or pain that gets worse. Get help right away if you have:  Numbness or tingling in your  lower legs that does not get better right after you take the stockings off.  Toes or feet that are unusually cold or turn a bluish color.  A warm or red area on your leg.  New swelling or soreness in your leg.  Shortness of breath.  Chest pain.  A fast or irregular heartbeat.  Light-headedness.  Dizziness. Summary  Compression stockings are elastic socks that squeeze the legs.  They help increase blood flow (circulation) to the legs, decrease swelling in the legs, and reduce the chance of developing blood clots in the lower legs.  Follow instructions from your health care provider about how and when to wear your compression stockings.  Do not stop wearing your compression stockings without talking to your health care provider first. This information is not intended to replace advice given to you by your health care provider. Make sure you discuss any questions you have with your health care provider. Document Released: 04/16/2009 Document Revised: 06/21/2017 Document  Reviewed: 06/21/2017 Elsevier Patient Education  [employer]2020 [employer]

## 2019-01-06 NOTE — Assessment & Plan Note (Signed)
Chronic. Associated with ETD and discomfort in her ears. To have EGD next week- await that, and concern for LPR. Will get her into see ENT (vocal specialist) for further evaluation. Referral generated today. If everything comes out normal, will get thyroid US. Call with any concerns.

## 2019-01-06 NOTE — Assessment & Plan Note (Signed)
R>L- advised compression hose, information provided today. Call with any concerns.

## 2019-01-06 NOTE — Assessment & Plan Note (Signed)
Has been stable on her 90mg  of armour thyroid. Will recheck labs today and treat as needed. Call with any concerns. Continue to monitor.

## 2019-01-06 NOTE — Progress Notes (Signed)
BP 127/83   Pulse 71   Temp 98.7 F (37.1 C) (Oral)   Ht 5\' 6"  (1.676 m)   Wt 245 lb (111.1 kg)   SpO2 97%   BMI 39.54 kg/m    Subjective:    Patient ID: Teresa Peters Teresa Peters, female    DOB: 06/20/1971, 48 y.o.   MRN: [id number]  HPI: Teresa GunnerCarla Teresa Peters is a 48 y.o. female who presents today to establish care. She last saw [facility] in December and had blood work done at that time. She notes that she has not had a PCP in a while. Has been seeing a PA for dietician and thyroid health. She notes that she had been doing quite well with treatment of thyroid, vitamin D and B12 deficiencies.   Chief Complaint  Patient presents with  . Establish Care   Feels like she has problems with ADD, had some issues with being consistent with keeping diets. Tried to keep a paleo diet in 2015 for about 6 months, but then not able to keep it up. She notes that she will have waves of depression due to issues at home  Notes that she has been having issues with TMJ/ear pain. She notes that about 5 years ago, she was having issues with excessive wax build-up. She started having some issues with voice change. Has seen ENT years ago and they thought it was reflux related. She is concerned that it may be her thyroid related. She feels like she has pain deep in her ear and is concerned about having issues with her eustachian tube. Had a root canal >10 years ago and is unsure if that is related to her ear. Would like to see an endodotic provider. Her pain is in her last, very back tooth on the L. She has pain in that tooth and it's quite sensitive when she brushes her tooth. She has had fillings removed. She notes that if she tries to sing, she gets very locked up and feels very tight in her head, neck and throat.   Impaired Fasting Glucose- wants to get back on paleo. Had gestational diabetes with her last pregnancy HbA1C: 6.0-6.6/7 Duration of elevated blood sugar: chronic- was on metformin but had a reaction  Polydipsia: no Polyuria: no Weight change: yes Visual disturbance: no Glucose Monitoring: no    Accucheck frequency: Not Checking Diabetic Education: Not Completed Family history of diabetes: yes  HYPOTHYROIDISM Thyroid control status:stable Satisfied with current treatment? yes Medication side effects: no Medication compliance: excellent compliance Recent dose adjustment:no Fatigue: yes Cold intolerance: no Heat intolerance: yes Weight gain: yes Weight loss: no Constipation: yes Diarrhea/loose stools: yes Palpitations: no Lower extremity edema: yes Anxiety/depressed mood: yes   Was having some issues with her gut in January. Became very nervous and saw GI- due to have colonoscopy on 01/14/19. Had some issues with her gall bladder and that seemed to resolve. She notes that she had an endoscopy done years ago with several polyps, but didn't have any removed.   Depression screen PHQ 2/9 01/06/2019  Decreased Interest 1  Down, Depressed, Hopeless 1  PHQ - 2 Score 2  Altered sleeping 0  Tired, decreased energy 1  Change in appetite 1  Feeling bad or failure about yourself  1  Trouble concentrating 0  Moving slowly or fidgety/restless 0  Suicidal thoughts 0  PHQ-9 Score 5  Difficult doing work/chores Not difficult at all     Active Ambulatory Problems    Diagnosis Date  Noted  . Mild anemia 06/10/2018  . Obesity 08/15/2018  . Pre-diabetes 08/15/2018  . Varicose veins of both legs with edema 08/15/2018  . Vitamin B 12 deficiency 08/15/2018  . Vitamin D deficiency 08/15/2018  . Biliary sludge determined by ultrasound 08/25/2018  . Acquired hypothyroidism 01/06/2019  . Change in voice 01/06/2019   Resolved Ambulatory Problems    Diagnosis Date Noted  . Vulvar lesion 03/23/2017  . Epidermal inclusion cyst 03/23/2017  . ADD (attention deficit disorder) 08/15/2018   Past Medical History:  Diagnosis Date  . Anemia   . Cellulitis   . Diabetes mellitus without  complication (HCC)   . Fatty liver   . Hypothyroidism   . Prediabetes    Past Surgical History:  Procedure Laterality Date  . ADENOIDECTOMY    . ADENOIDECTOMY    . ESOPHAGOGASTRODUODENOSCOPY  2012   polyps (pt states they were not removed)  . FOOT SURGERY     Outpatient Encounter Medications as of 01/06/2019  Medication Sig  . B Complex Vitamins (VITAMIN B COMPLEX PO) Take by mouth daily.  . Ferrous Sulfate (IRON PO) Take by mouth daily.  . Menaquinone-7 (VITAMIN K2 PO) Take by mouth daily.  Marland Kitchen. thyroid (ARMOUR) 90 MG tablet Take 90 mg by mouth daily.  Marland Kitchen. VITAMIN D PO Take by mouth daily.   No facility-administered encounter medications on file as of 01/06/2019.    Allergies  Allergen Reactions  . Metformin And Related     Per previous provider's notes    Social History   Socioeconomic History  . Marital status: Married    Spouse name: Not on file  . Number of children: Not on file  . Years of education: Not on file  . Highest education level: Not on file  Occupational History  . Not on file  Social Needs  . Financial resource strain: Not on file  . Food insecurity    Worry: Not on file    Inability: Not on file  . Transportation needs    Medical: Not on file    Non-medical: Not on file  Tobacco Use  . Smoking status: Never Smoker  . Smokeless tobacco: Never Used  Substance and Sexual Activity  . Alcohol use: Yes    Comment: rare - 1x/month  . Drug use: No  . Sexual activity: Yes    Birth control/protection: None  Lifestyle  . Physical activity    Days per week: Not on file    Minutes per session: Not on file  . Stress: Not on file  Relationships  . Social Musicianconnections    Talks on phone: Not on file    Gets together: Not on file    Attends religious service: Not on file    Active member of club or organization: Not on file    Attends meetings of clubs or organizations: Not on file    Relationship status: Not on file  Other Topics Concern  . Not on file   Social History Narrative  . Not on file   Family History  Problem Relation Age of Onset  . Glaucoma Mother   . Cataracts Mother   . Diabetes Mellitus II Paternal Grandfather   . Diabetes Paternal Grandfather   . Hypertension Father   . Fibromyalgia Sister   . Obesity Sister   . Thyroid disease Son   . Raynaud syndrome Paternal Grandmother     Review of Systems  Constitutional: Positive for fatigue and unexpected weight change. Negative for  activity change, appetite change, chills, diaphoresis and fever.  HENT: Positive for congestion, dental problem, ear pain, sinus pressure, sinus pain and voice change. Negative for drooling, ear discharge, facial swelling, hearing loss, mouth sores, nosebleeds, postnasal drip, rhinorrhea, sneezing, sore throat, tinnitus and trouble swallowing.   Eyes: Negative.   Respiratory: Negative.   Cardiovascular: Positive for leg swelling. Negative for chest pain and palpitations.  Gastrointestinal: Negative.   Endocrine: Negative.   Neurological: Negative.   Psychiatric/Behavioral: Positive for decreased concentration and dysphoric mood. Negative for agitation, behavioral problems, confusion, hallucinations, self-injury, sleep disturbance and suicidal ideas. The patient is nervous/anxious. The patient is not hyperactive.     Per HPI unless specifically indicated above     Objective:    BP 127/83   Pulse 71   Temp 98.7 F (37.1 C) (Oral)   Ht 5\' 6"  (1.676 m)   Wt 245 lb (111.1 kg)   SpO2 97%   BMI 39.54 kg/m   Wt Readings from Last 3 Encounters:  01/06/19 245 lb (111.1 kg)  11/06/18 242 lb (109.8 kg)  08/27/18 240 lb 12.8 oz (109.2 kg)    Physical Exam Vitals signs and nursing note reviewed.  Constitutional:      General: She is not in acute distress.    Appearance: Normal appearance. She is obese. She is not ill-appearing, toxic-appearing or diaphoretic.  HENT:     Head: Normocephalic and atraumatic.     Right Ear: External ear normal.  There is impacted cerumen.     Left Ear: External ear normal. There is impacted cerumen.     Nose: Nose normal. No congestion or rhinorrhea.     Mouth/Throat:     Mouth: Mucous membranes are moist.     Pharynx: Oropharynx is clear. No oropharyngeal exudate or posterior oropharyngeal erythema.  Eyes:     General: No scleral icterus.       Right eye: No discharge.        Left eye: No discharge.     Extraocular Movements: Extraocular movements intact.     Conjunctiva/sclera: Conjunctivae normal.     Pupils: Pupils are equal, round, and reactive to light.  Neck:     Musculoskeletal: Normal range of motion and neck supple.  Cardiovascular:     Rate and Rhythm: Normal rate and regular rhythm.     Pulses: Normal pulses.     Heart sounds: Normal heart sounds. No murmur. No friction rub. No gallop.   Pulmonary:     Effort: Pulmonary effort is normal. No respiratory distress.     Breath sounds: Normal breath sounds. No stridor. No wheezing, rhonchi or rales.  Chest:     Chest wall: No tenderness.  Musculoskeletal: Normal range of motion.  Skin:    General: Skin is warm and dry.     Capillary Refill: Capillary refill takes less than 2 seconds.     Coloration: Skin is not jaundiced or pale.     Findings: No bruising, erythema, lesion or rash.  Neurological:     General: No focal deficit present.     Mental Status: She is alert and oriented to person, place, and time. Mental status is at baseline.  Psychiatric:        Mood and Affect: Mood normal.        Behavior: Behavior normal.        Thought Content: Thought content normal.        Judgment: Judgment normal.     Results for orders  placed or performed in visit on 11/06/18  HM PAP SMEAR  Result Value Ref Range   HM Pap smear normal, HPV -       Assessment & Plan:   Problem List Items Addressed This Visit      Cardiovascular and Mediastinum   Varicose veins of both legs with edema    R>L- advised compression hose, information  provided today. Call with any concerns.         Endocrine   Acquired hypothyroidism    Has been stable on her 90mg  of armour thyroid. Will recheck labs today and treat as needed. Call with any concerns. Continue to monitor.       Relevant Orders   CBC with Differential/Platelet   Comprehensive metabolic panel   Thyroid Panel With TSH     Other   Mild anemia    Checking labs today. Await results. Treat as needed.       Relevant Orders   Ferritin   Obesity    Did great on paleo previously- work on diet and exercise with goal of losing 1-2lbs per week. Call with any concerns.       Pre-diabetes - Primary    A1c in February 2020 was 6.3- rechecking labs today. Await results. Treat as needed. Continue to monitor.       Relevant Orders   Bayer DCA Hb A1c Waived   CBC with Differential/Platelet   Comprehensive metabolic panel   UA/M w/rflx Culture, Routine   Vitamin B 12 deficiency    Checking labs today. Await results. Treat as needed.       Relevant Orders   CBC with Differential/Platelet   B12   Vitamin D deficiency    Checking labs today. Await results. Treat as needed.       Relevant Orders   VITAMIN D 25 Hydroxy (Vit-D Deficiency, Fractures)   Change in voice    Chronic. Associated with ETD and discomfort in her ears. To have EGD next week- await that, and concern for LPR. Will get her into see ENT (vocal specialist) for further evaluation. Referral generated today. If everything comes out normal, will get thyroid US. Call with any concerns.       Relevant Orders   Ambulatory referral to ENT    Other Visit Diagnoses    Screening for cholesterol level       Checking labs today. Await results. Treat as needed.    Relevant Orders   Comprehensive metabolic panel   Lipid Panel w/o Chol/HDL Ratio   Screening for HIV without presence of risk factors       Checking labs today. Await results.    Relevant Orders   HIV Antibody (routine testing w rflx)        Follow up plan: Return 1-3 months, for follow up ear/voice/thyroid issues.

## 2019-01-06 NOTE — Assessment & Plan Note (Signed)
Did great on paleo previously- work on diet and exercise with goal of losing 1-2lbs per week. Call with any concerns.

## 2019-01-07 ENCOUNTER — Other Ambulatory Visit: Payer: Self-pay

## 2019-01-07 ENCOUNTER — Encounter: Payer: Self-pay | Admitting: *Deleted

## 2019-01-07 LAB — COMPREHENSIVE METABOLIC PANEL
ALT: 17 IU/L (ref 0–32)
AST: 23 IU/L (ref 0–40)
Albumin/Globulin Ratio: 1.7 (ref 1.2–2.2)
Albumin: 4.4 g/dL (ref 3.8–4.8)
Alkaline Phosphatase: 73 IU/L (ref 39–117)
BUN/Creatinine Ratio: 16 (ref 9–23)
BUN: 14 mg/dL (ref 6–24)
Bilirubin Total: <0.2 mg/dL (ref 0.0–1.2)
CO2: 23 mmol/L (ref 20–29)
Calcium: 9.4 mg/dL (ref 8.7–10.2)
Chloride: 101 mmol/L (ref 96–106)
Creatinine, Ser: 0.85 mg/dL (ref 0.57–1.00)
GFR calc Af Amer: 93 mL/min/{1.73_m2} (ref >59)
GFR calc non Af Amer: 81 mL/min/{1.73_m2} (ref >59)
Globulin, Total: 2.6 g/dL (ref 1.5–4.5)
Glucose: 98 mg/dL (ref 65–99)
Potassium: 4.4 mmol/L (ref 3.5–5.2)
Sodium: 138 mmol/L (ref 134–144)
Total Protein: 7 g/dL (ref 6.0–8.5)

## 2019-01-07 LAB — UA/M W/RFLX CULTURE, ROUTINE
Bilirubin, UA: NEGATIVE
Glucose, UA: NEGATIVE
Ketones, UA: NEGATIVE
Leukocytes,UA: NEGATIVE
Nitrite, UA: NEGATIVE
Protein,UA: NEGATIVE
Specific Gravity, UA: 1.02 (ref 1.005–1.030)
Urobilinogen, Ur: 0.2 mg/dL (ref 0.2–1.0)
pH, UA: 6 (ref 5.0–7.5)

## 2019-01-07 LAB — LIPID PANEL W/O CHOL/HDL RATIO
Cholesterol, Total: 194 mg/dL (ref 100–199)
HDL: 49 mg/dL (ref >39)
LDL Calculated: 108 mg/dL — ABNORMAL HIGH (ref 0–99)
Triglycerides: 183 mg/dL — ABNORMAL HIGH (ref 0–149)
VLDL Cholesterol Cal: 37 mg/dL (ref 5–40)

## 2019-01-07 LAB — CBC WITH DIFFERENTIAL/PLATELET
Basophils Absolute: 0.1 10*3/uL (ref 0.0–0.2)
Basos: 1 %
EOS (ABSOLUTE): 0.3 10*3/uL (ref 0.0–0.4)
Eos: 3 %
Hematocrit: 41.9 % (ref 34.0–46.6)
Hemoglobin: 13.7 g/dL (ref 11.1–15.9)
Immature Grans (Abs): 0 10*3/uL (ref 0.0–0.1)
Immature Granulocytes: 0 %
Lymphocytes Absolute: 2.5 10*3/uL (ref 0.7–3.1)
Lymphs: 21 %
MCH: 28.1 pg (ref 26.6–33.0)
MCHC: 32.7 g/dL (ref 31.5–35.7)
MCV: 86 fL (ref 79–97)
Monocytes Absolute: 0.7 10*3/uL (ref 0.1–0.9)
Monocytes: 6 %
Neutrophils Absolute: 8.2 10*3/uL — ABNORMAL HIGH (ref 1.4–7.0)
Neutrophils: 69 %
Platelets: 275 10*3/uL (ref 150–450)
RBC: 4.88 x10E6/uL (ref 3.77–5.28)
RDW: 13.4 % (ref 11.7–15.4)
WBC: 11.8 10*3/uL — ABNORMAL HIGH (ref 3.4–10.8)

## 2019-01-07 LAB — MICROSCOPIC EXAMINATION
Bacteria, UA: NONE SEEN
WBC, UA: NONE SEEN /hpf (ref 0–5)

## 2019-01-07 LAB — FERRITIN: Ferritin: 26 ng/mL (ref 15–150)

## 2019-01-07 LAB — THYROID PANEL WITH TSH
Free Thyroxine Index: 2 (ref 1.2–4.9)
T3 Uptake Ratio: 33 % (ref 24–39)
T4, Total: 6.1 ug/dL (ref 4.5–12.0)
TSH: 1.45 u[IU]/mL (ref 0.450–4.500)

## 2019-01-07 LAB — VITAMIN B12: Vitamin B-12: 521 pg/mL (ref 232–1245)

## 2019-01-07 LAB — HIV ANTIBODY (ROUTINE TESTING W REFLEX): HIV Screen 4th Generation wRfx: NONREACTIVE

## 2019-01-07 LAB — VITAMIN D 25 HYDROXY (VIT D DEFICIENCY, FRACTURES): Vit D, 25-Hydroxy: 31.8 ng/mL (ref 30.0–100.0)

## 2019-01-07 LAB — BAYER DCA HB A1C WAIVED: HB A1C (BAYER DCA - WAIVED): 6.3 % (ref <7.0)

## 2019-01-08 ENCOUNTER — Other Ambulatory Visit: Payer: Self-pay

## 2019-01-08 DIAGNOSIS — Z01812 Encounter for preprocedural laboratory examination: Secondary | ICD-10-CM

## 2019-01-08 NOTE — Anesthesia Preprocedure Evaluation (Addendum)
Anesthesia Evaluation  Patient identified by MRN, date of birth, ID band Patient awake    Reviewed: Allergy & Precautions, NPO status , Patient's Chart, lab work & pertinent test results  History of Anesthesia Complications Negative for: history of anesthetic complications  Airway Mallampati: II   Neck ROM: Full    Dental no notable dental hx.    Pulmonary neg pulmonary ROS,    Pulmonary exam normal breath sounds clear to auscultation       Cardiovascular Exercise Tolerance: Good negative cardio ROS Normal cardiovascular exam Rhythm:Regular Rate:Normal     Neuro/Psych negative neurological ROS     GI/Hepatic negative GI ROS, Fatty liver   Endo/Other  diabetes (prediabetes)Hypothyroidism Obesity   Renal/GU negative Renal ROS     Musculoskeletal   Abdominal   Peds  Hematology  (+) Blood dyscrasia, anemia ,   Anesthesia Other Findings   Reproductive/Obstetrics                            Anesthesia Physical Anesthesia Plan  ASA: II  Anesthesia Plan: General   Post-op Pain Management:    Induction: Intravenous  PONV Risk Score and Plan: 3 and Propofol infusion and TIVA  Airway Management Planned: Natural Airway  Additional Equipment:   Intra-op Plan:   Post-operative Plan:   Informed Consent: I have reviewed the patients History and Physical, chart, labs and discussed the procedure including the risks, benefits and alternatives for the proposed anesthesia with the patient or authorized representative who has indicated his/her understanding and acceptance.       Plan Discussed with: CRNA  Anesthesia Plan Comments:        Anesthesia Quick Evaluation

## 2019-01-10 ENCOUNTER — Other Ambulatory Visit
Admission: RE | Admit: 2019-01-10 | Discharge: 2019-01-10 | Disposition: A | Discharge status: Home / Self Care | Payer: BC Managed Care – PPO | Source: Ambulatory Visit | Attending: Gastroenterology | Admitting: Gastroenterology

## 2019-01-10 ENCOUNTER — Other Ambulatory Visit: Payer: Self-pay

## 2019-01-10 DIAGNOSIS — Z01812 Encounter for preprocedural laboratory examination: Secondary | ICD-10-CM | POA: Insufficient documentation

## 2019-01-10 DIAGNOSIS — Z1159 Encounter for screening for other viral diseases: Secondary | ICD-10-CM | POA: Diagnosis not present

## 2019-01-11 LAB — SARS CORONAVIRUS 2 (TAT 6-24 HRS): SARS Coronavirus 2: NEGATIVE

## 2019-01-12 ENCOUNTER — Encounter: Payer: Self-pay | Admitting: Family Medicine

## 2019-01-13 NOTE — Discharge Instructions (Signed)

## 2019-01-14 ENCOUNTER — Encounter
Admission: RE | Disposition: A | Discharge status: Home / Self Care | Payer: Self-pay | Source: Home / Self Care | Attending: Gastroenterology

## 2019-01-14 ENCOUNTER — Other Ambulatory Visit: Payer: Self-pay

## 2019-01-14 ENCOUNTER — Ambulatory Visit: Payer: BC Managed Care – PPO | Admitting: Anesthesiology

## 2019-01-14 ENCOUNTER — Ambulatory Visit
Admission: RE | Admit: 2019-01-14 | Discharge: 2019-01-14 | Disposition: A | Discharge status: Home / Self Care | Payer: BC Managed Care – PPO | Attending: Gastroenterology | Admitting: Gastroenterology

## 2019-01-14 DIAGNOSIS — Z79899 Other long term (current) drug therapy: Secondary | ICD-10-CM | POA: Diagnosis not present

## 2019-01-14 DIAGNOSIS — Z79890 Hormone replacement therapy (postmenopausal): Secondary | ICD-10-CM | POA: Insufficient documentation

## 2019-01-14 DIAGNOSIS — K649 Unspecified hemorrhoids: Secondary | ICD-10-CM | POA: Diagnosis not present

## 2019-01-14 DIAGNOSIS — D649 Anemia, unspecified: Secondary | ICD-10-CM | POA: Diagnosis not present

## 2019-01-14 DIAGNOSIS — K317 Polyp of stomach and duodenum: Secondary | ICD-10-CM | POA: Diagnosis not present

## 2019-01-14 DIAGNOSIS — R1011 Right upper quadrant pain: Secondary | ICD-10-CM

## 2019-01-14 DIAGNOSIS — Z6839 Body mass index (BMI) 39.0-39.9, adult: Secondary | ICD-10-CM | POA: Insufficient documentation

## 2019-01-14 DIAGNOSIS — E669 Obesity, unspecified: Secondary | ICD-10-CM | POA: Insufficient documentation

## 2019-01-14 DIAGNOSIS — E119 Type 2 diabetes mellitus without complications: Secondary | ICD-10-CM | POA: Insufficient documentation

## 2019-01-14 DIAGNOSIS — Z833 Family history of diabetes mellitus: Secondary | ICD-10-CM | POA: Insufficient documentation

## 2019-01-14 DIAGNOSIS — Z1211 Encounter for screening for malignant neoplasm of colon: Secondary | ICD-10-CM

## 2019-01-14 DIAGNOSIS — K760 Fatty (change of) liver, not elsewhere classified: Secondary | ICD-10-CM | POA: Insufficient documentation

## 2019-01-14 DIAGNOSIS — K648 Other hemorrhoids: Secondary | ICD-10-CM | POA: Diagnosis not present

## 2019-01-14 DIAGNOSIS — E039 Hypothyroidism, unspecified: Secondary | ICD-10-CM | POA: Insufficient documentation

## 2019-01-14 HISTORY — PX: POLYPECTOMY: SHX5525

## 2019-01-14 HISTORY — PX: COLONOSCOPY WITH PROPOFOL: SHX5780

## 2019-01-14 HISTORY — PX: ESOPHAGOGASTRODUODENOSCOPY (EGD) WITH PROPOFOL: SHX5813

## 2019-01-14 SURGERY — COLONOSCOPY WITH PROPOFOL
Anesthesia: General | Site: Rectum

## 2019-01-14 MED ORDER — PROPOFOL 10 MG/ML IV BOLUS
INTRAVENOUS | Status: DC | PRN
  Administered 2019-01-14 (×2): 100 mg via INTRAVENOUS
  Administered 2019-01-14: 50 mg via INTRAVENOUS

## 2019-01-14 MED ORDER — LIDOCAINE HCL (CARDIAC) PF 100 MG/5ML IV SOSY
PREFILLED_SYRINGE | INTRAVENOUS | Status: DC | PRN
  Administered 2019-01-14: 40 mg via INTRAVENOUS

## 2019-01-14 MED ORDER — STERILE WATER FOR IRRIGATION IR SOLN
Status: DC | PRN
  Administered 2019-01-14: 08:00:00 15 mL

## 2019-01-14 MED ORDER — GLYCOPYRROLATE 0.2 MG/ML IJ SOLN
INTRAMUSCULAR | Status: DC | PRN
  Administered 2019-01-14: 0.2 mg via INTRAVENOUS

## 2019-01-14 MED ORDER — ACETAMINOPHEN 325 MG PO TABS: 650.0000 mg | ORAL_TABLET | Freq: Once | ORAL | Status: DC | PRN

## 2019-01-14 MED ORDER — ACETAMINOPHEN 160 MG/5ML PO SOLN: 325.0000 mg | ORAL | Status: DC | PRN

## 2019-01-14 MED ORDER — LACTATED RINGERS IV SOLN
INTRAVENOUS | Status: DC | PRN
  Administered 2019-01-14: 09:00:00 via INTRAVENOUS

## 2019-01-14 MED ORDER — ONDANSETRON HCL 4 MG/2ML IJ SOLN: 4.0000 mg | Freq: Once | INTRAMUSCULAR | Status: DC | PRN

## 2019-01-14 SURGICAL SUPPLY — 8 items
BLOCK BITE 60FR ADLT L/F GRN (MISCELLANEOUS) ×5 IMPLANT
CANISTER SUCT 1200ML W/VALVE (MISCELLANEOUS) ×5 IMPLANT
FORCEPS BIOP RAD 4 LRG CAP 4 (CUTTING FORCEPS) ×2 IMPLANT
GOWN CVR UNV OPN BCK APRN NK (MISCELLANEOUS) ×6 IMPLANT
GOWN ISOL THUMB LOOP REG UNIV (MISCELLANEOUS) ×4
KIT ENDO PROCEDURE OLY (KITS) ×5 IMPLANT
WATER STERILE IRR 250ML POUR (IV SOLUTION) ×5 IMPLANT
WATER STERILE IRR 500ML POUR (IV SOLUTION) ×2 IMPLANT

## 2019-01-14 NOTE — Op Note (Signed)
[facility]astroenterology Patient Name: Teresa Peters Procedure Date: 01/14/2019 7:44 AM MRN: [id number] Account #: [id number] Date of Birth: 11/17/1970 Admit Type: Outpatient Age: 48 Room: [facility] OR ROOM 01 Gender: Female Note Status: Finalized Procedure:            Upper GI endoscopy Indications:          Abdominal pain in the right upper quadrant, Follow-up                        of gastric polyps Providers:            Ikhlas Albo B. Bonna Gains MD, MD Referring MD:         Valerie Roys (Referring MD) Medicines:            Monitored Anesthesia Care Complications:        No immediate complications. Procedure:            Pre-Anesthesia Assessment:                       - Prior to the procedure, a History and Physical was                        performed, and patient medications, allergies and                        sensitivities were reviewed. The patient's tolerance of                        previous anesthesia was reviewed.                       - The risks and benefits of the procedure and the                        sedation options and risks were discussed with the                        patient. All questions were answered and informed                        consent was obtained.                       - Patient identification and proposed procedure were                        verified prior to the procedure by the physician, the                        nurse, the anesthesiologist, the anesthetist and the                        technician. The procedure was verified in the procedure                        room.                       - ASA Grade Assessment: II - A patient with mild  systemic disease.                       After obtaining informed consent, the endoscope was                        passed under direct vision. Throughout the procedure,                        the patient's blood pressure, pulse, and oxygen    saturations were monitored continuously. The was                        introduced through the mouth, and advanced to the                        second part of duodenum. The upper GI endoscopy was                        accomplished with ease. The patient tolerated the                        procedure well. Findings:      The examined esophagus was normal.      Multiple 4 to 8 mm sessile polyps with no bleeding and no stigmata of       recent bleeding were found in the gastric body. Biopsies were taken with       a cold forceps for histology.      The exam of the stomach was otherwise normal.      There is no endoscopic evidence of erythema, erythema or inflammatory       changes suggestive of gastritis or ulceration in the entire examined       stomach. Biopsies were obtained in the gastric body, at the incisura and       in the gastric antrum with cold forceps for histology.      The duodenal bulb, second portion of the duodenum and examined duodenum       were normal. Impression:           - Normal esophagus.                       - Multiple gastric polyps. Biopsied.                       - Normal duodenal bulb, second portion of the duodenum                        and examined duodenum.                       - Biopsies were obtained in the gastric body, at the                        incisura and in the gastric antrum. Recommendation:       - Await pathology results.                       - Discharge patient to home (with escort).                       -  Advance diet as tolerated.                       - Continue present medications.                       - Patient has a contact number available for                        emergencies. The signs and symptoms of potential                        delayed complications were discussed with the patient.                        Return to normal activities tomorrow. Written discharge                        instructions were provided to the  patient.                       - Discharge patient to home (with escort).                       - The findings and recommendations were discussed with                        the patient.                       - The findings and recommendations were discussed with                        the patient's family. Procedure Code(s):    --- Professional ---                       [phone], Esophagogastroduodenoscopy, flexible, transoral;                        with biopsy, single or multiple Diagnosis Code(s):    --- Professional ---                       K31.7, Polyp of stomach and duodenum                       R10.11, Right upper quadrant pain CPT copyright 2019 [employer]. All rights reserved. The codes documented in this report are preliminary and upon coder review may  be revised to meet current compliance requirements.  Melodie BouillonVarnita Banesa Tristan, MD Michel BickersVarnita B. Maximino Greenlandahiliani MD, MD 01/14/2019 8:17:00 AM This report has been signed electronically. Number of Addenda: 0 Note Initiated On: 01/14/2019 7:44 AM Total Procedure Duration: 0 hours 8 minutes 39 seconds  Estimated Blood Loss: Estimated blood loss: none.      [facility]

## 2019-01-14 NOTE — Anesthesia Postprocedure Evaluation (Signed)
Anesthesia Post Note  Patient: Teresa Peters  Procedure(s) Performed: COLONOSCOPY WITH PROPOFOL (N/A Rectum) ESOPHAGOGASTRODUODENOSCOPY (EGD) WITH BIOPSY (N/A Esophagus) POLYPECTOMY (N/A )  Patient location during evaluation: PACU Anesthesia Type: General Level of consciousness: awake and alert, oriented and patient cooperative Pain management: pain level controlled Vital Signs Assessment: post-procedure vital signs reviewed and stable Respiratory status: spontaneous breathing, nonlabored ventilation and respiratory function stable Cardiovascular status: blood pressure returned to baseline and stable Postop Assessment: adequate PO intake Anesthetic complications: no    Darrin Nipper

## 2019-01-14 NOTE — Op Note (Signed)
[facility] Gastroenterology Patient Name: Teresa Peters Procedure Date: 01/14/2019 7:34 AM MRN: [id number] Account #: [id number] Date of Birth: 01/11/1971 Admit Type: Outpatient Age: [age] [facility] 01 Gender: Female Note Status: Finalized Procedure:            Colonoscopy Indications:          Screening for colorectal malignant neoplasm Providers:            Galdino Hinchman B. Maximino Greenlandahiliani MD, MD Medicines:            Monitored Anesthesia Care Complications:        No immediate complications. Procedure:            Pre-Anesthesia Assessment:                       - Prior to the procedure, a History and Physical was                        performed, and patient medications, allergies and                        sensitivities were reviewed. The patient's tolerance of                        previous anesthesia was reviewed.                       - The risks and benefits of the procedure and the                        sedation options and risks were discussed with the                        patient. All questions were answered and informed                        consent was obtained.                       - Patient identification and proposed procedure were                        verified prior to the procedure by the physician, the                        nurse, the anesthetist and the technician. The                        procedure was verified in the pre-procedure area in the                        procedure room in the endoscopy suite.                       - ASA Grade Assessment: II - A patient with mild                        systemic disease.                       - After reviewing the risks and benefits, the patient  was deemed in satisfactory condition to undergo the                        procedure.                       After obtaining informed consent, the colonoscope was                        passed under direct vision. Throughout the  procedure,                        the patient's blood pressure, pulse, and oxygen                        saturations were monitored continuously. The was                        introduced through the anus and advanced to the the                        cecum, identified by appendiceal orifice and ileocecal                        valve. The colonoscopy was performed with ease. The                        patient tolerated the procedure well. The quality of                        the bowel preparation was good. Findings:      Hemorrhoids were found on perianal exam.      The rectum, sigmoid colon, descending colon, transverse colon, ascending       colon and cecum appeared normal.      The retroflexed view of the distal rectum and anal verge was normal and       showed no anal or rectal abnormalities. Impression:           - Hemorrhoids found on perianal exam.                       - The rectum, sigmoid colon, descending colon,                        transverse colon, ascending colon and cecum are normal.                       - The distal rectum and anal verge are normal on                        retroflexion view.                       - No specimens collected. Recommendation:       - Return to my office in 4 weeks to discuss gastric                        polyps.                       - Discharge patient to home.                       -  Resume previous diet.                       - Continue present medications.                       - Repeat colonoscopy in 10 years for screening purposes.                       - Return to primary care physician as previously                        scheduled.                       - The findings and recommendations were discussed with                        the patient.                       - The findings and recommendations were discussed with                        the patient's family.                       - In the future, if patient develops new  symptoms such                        as blood per rectum, abdominal pain, weight loss,                        altered bowel habits or any other reason for concern,                        patient should discuss this with thier PCP as they may                        need a GI referral at that time or evaluation for need                        for colonoscopy earlier than the recommended screening                        colonoscopy.                       In addition, if patient's family history of colon                        cancer changes (no family history at this time) in the                        future, earlier screening may be indicated and patient                        should discuss this with PCP as well. Procedure Code(s):    --- Professional ---                       N1700, Colorectal cancer screening;  colonoscopy on                        individual not meeting criteria for high risk Diagnosis Code(s):    --- Professional ---                       Z12.11, Encounter for screening for malignant neoplasm                        of colon                       K64.9, Unspecified hemorrhoids CPT copyright 2019 American Medical Association. All rights reserved. The codes documented in this report are preliminary and upon coder review may  be revised to meet current compliance requirements.  Melodie BouillonVarnita Larene Ascencio, MD Michel BickersVarnita B. Maximino Greenlandahiliani MD, MD 01/14/2019 8:40:23 AM This report has been signed electronically. Number of Addenda: 0 Note Initiated On: 01/14/2019 7:34 AM Scope Withdrawal Time: 0 hours 9 minutes 37 seconds  Total Procedure Duration: 0 hours 18 minutes 58 seconds       [facility]

## 2019-01-14 NOTE — H&P (Signed)
Teresa BouillonVarnita Darely Becknell, MD [address], [address], [place], [place], [id number] [address], [address], [place], [place], [address] Phone: [phone]  Fax: [phone]  Primary Care Physician:  Teresa CarrowJohnson, Megan P, DO   Pre-Procedure History & Physical: HPI:  Teresa GunnerCarla Doris Peters is a 48 y.o. female is here for a colonoscopy and EGD.   Past Medical History:  Diagnosis Date  . Anemia   . Cellulitis   . Diabetes mellitus without complication (HCC)   . Fatty liver   . Hypothyroidism   . Obesity   . Prediabetes     Past Surgical History:  Procedure Laterality Date  . ADENOIDECTOMY    . ADENOIDECTOMY    . ESOPHAGOGASTRODUODENOSCOPY  2012   polyps (pt states they were not removed)  . FOOT SURGERY      Prior to Admission medications   Medication Sig Start Date End Date Taking? Authorizing Provider  B Complex Vitamins (VITAMIN B COMPLEX PO) Take by mouth daily.   Yes [provider]  Ferrous Sulfate (IRON PO) Take by mouth daily.   Yes [provider]  Menaquinone-7 (VITAMIN K2 PO) Take by mouth daily.   Yes [provider]  thyroid (ARMOUR) 90 MG tablet Take 90 mg by mouth daily.   Yes [provider]  VITAMIN D PO Take by mouth daily.   Yes [provider]    Allergies as of 10/07/2018  . (No Known Allergies)    Family History  Problem Relation Age of Onset  . Glaucoma Mother   . Cataracts Mother   . Diabetes Mellitus II Paternal Grandfather   . Diabetes Paternal Grandfather   . Hypertension Father   . Fibromyalgia Sister   . Obesity Sister   . Thyroid disease Son   . Raynaud syndrome Paternal Grandmother     Social History   Socioeconomic History  . Marital status: Married    Spouse name: Not on file  . Number of children: Not on file  . Years of education: Not on file  . Highest education level: Not on file  Occupational History  . Not on file  Social Needs  . Financial resource strain: Not on file  . Food  insecurity    Worry: Not on file    Inability: Not on file  . Transportation needs    Medical: Not on file    Non-medical: Not on file  Tobacco Use  . Smoking status: Never Smoker  . Smokeless tobacco: Never Used  Substance and Sexual Activity  . Alcohol use: Yes    Comment: rare - 1x/month  . Drug use: No  . Sexual activity: Yes    Birth control/protection: None  Lifestyle  . Physical activity    Days per week: Not on file    Minutes per session: Not on file  . Stress: Not on file  Relationships  . Social Musicianconnections    Talks on phone: Not on file    Gets together: Not on file    Attends religious service: Not on file    Active member of club or organization: Not on file    Attends meetings of clubs or organizations: Not on file    Relationship status: Not on file  . Intimate partner violence    Fear of current or ex partner: Not on file    Emotionally abused: Not on file    Physically abused: Not on file    Forced sexual activity: Not on file  Other Topics  Concern  . Not on file  Social History Narrative  . Not on file    Review of Systems: See HPI, otherwise negative ROS  Physical Exam: BP 130/68   Pulse 61   Temp 97.9 F (36.6 C) (Temporal)   Wt 109.8 kg   LMP 12/21/2018 (Approximate) Comment: pregnancy test negative  SpO2 100%   BMI 39.06 kg/m  General:   Alert,  pleasant and cooperative in NAD Head:  Normocephalic and atraumatic. Neck:  Supple; no masses or thyromegaly. Lungs:  Clear throughout to auscultation, normal respiratory effort.    Heart:  +S1, +S2, Regular rate and rhythm, No edema. Abdomen:  Soft, nontender and nondistended. Normal bowel sounds, without guarding, and without rebound.   Neurologic:  Alert and  oriented x4;  grossly normal neurologically.  Impression/Plan: Teresa Peters is here for a colonoscopy to be performed for average risk screening and EGD for Abdominal pain  Risks, benefits, limitations, and alternatives  regarding the procedures have been reviewed with the patient.  Questions have been answered.  All parties agreeable.   Virgel Manifold, MD  01/14/2019, 7:47 AM

## 2019-01-15 ENCOUNTER — Encounter: Payer: Self-pay | Admitting: Gastroenterology

## 2019-01-15 LAB — POCT PREGNANCY, URINE: Preg Test, Ur: NEGATIVE

## 2019-01-20 ENCOUNTER — Encounter: Payer: Self-pay | Admitting: Gastroenterology

## 2019-01-28 DIAGNOSIS — Z6837 Body mass index (BMI) 37.0-37.9, adult: Secondary | ICD-10-CM | POA: Diagnosis not present

## 2019-01-28 DIAGNOSIS — R499 Unspecified voice and resonance disorder: Secondary | ICD-10-CM | POA: Diagnosis not present

## 2019-01-28 DIAGNOSIS — R490 Dysphonia: Secondary | ICD-10-CM | POA: Diagnosis not present

## 2019-01-30 DIAGNOSIS — R490 Dysphonia: Secondary | ICD-10-CM | POA: Diagnosis not present

## 2019-02-06 DIAGNOSIS — R490 Dysphonia: Secondary | ICD-10-CM | POA: Diagnosis not present

## 2019-02-20 DIAGNOSIS — R490 Dysphonia: Secondary | ICD-10-CM | POA: Diagnosis not present

## 2019-03-12 ENCOUNTER — Telehealth: Payer: Self-pay | Admitting: Gastroenterology

## 2019-03-12 NOTE — Telephone Encounter (Signed)
Left vm to offer 4-6 week f/u apt with Dr. Tahiliani 

## 2019-03-12 NOTE — Telephone Encounter (Signed)
-----   Message from Virgel Manifold, MD sent at 03/12/2019  2:52 PM EDT -----  Please set up clinic appointment or virtual visit with me in 4-6 weeks

## 2019-04-15 ENCOUNTER — Ambulatory Visit (INDEPENDENT_AMBULATORY_CARE_PROVIDER_SITE_OTHER): Payer: BC Managed Care – PPO | Admitting: Family Medicine

## 2019-04-15 ENCOUNTER — Other Ambulatory Visit: Payer: Self-pay

## 2019-04-15 ENCOUNTER — Encounter: Payer: Self-pay | Admitting: Family Medicine

## 2019-04-15 VITALS — BP 147/86 | HR 83 | Temp 98.6°F

## 2019-04-15 DIAGNOSIS — R499 Unspecified voice and resonance disorder: Secondary | ICD-10-CM | POA: Diagnosis not present

## 2019-04-15 DIAGNOSIS — M26609 Unspecified temporomandibular joint disorder, unspecified side: Secondary | ICD-10-CM | POA: Diagnosis not present

## 2019-04-15 NOTE — Patient Instructions (Signed)
Dr. Keith Yount, DDS, MAGD [facility] [address] [place], [place], [address] [phone] 

## 2019-04-15 NOTE — Assessment & Plan Note (Signed)
Will hook back up with [facility] ENT and speech therapy. Continue to monitor. Call with any concerns.

## 2019-04-15 NOTE — Assessment & Plan Note (Signed)
Will hook back in with her endodontist and her dentist. Information about TMJ specialist provided today. Call with any concerns. Continue to monitor.

## 2019-04-15 NOTE — Progress Notes (Signed)
BP (!) 147/86   Pulse 83   Temp 98.6 F (37 C)   SpO2 96%    Subjective:    Patient ID: Teresa Peters, female    DOB: Oct 25, 1970, 48 y.o.   MRN: [id number]  HPI: Teresa Peters is a 47 y.o. female  Chief Complaint  Patient presents with  . Referral    Patient has been seen by [facility] for voice therapy a couple times, but has not finished therapy yet, will call and schedule the rest of the visit, per ENT ear looked fine besides some wax.    Saw ENT- they thought it might be a bit of LPR and behavior dysphonia. Got her into speech therapy and advised 2nd opinion with endodontist. Due to see them again in November. She is not totally convinced about what the ENT says. She notes that she is still having issues with singing. Has not finished vocal therapy yet. She notes that she is continuing to have issues with TMJ and issues with her jaw. She has not seen her endodontist yet. She notes that she is feeling better, but she is still having issues with a fullness in her R ear. She continues to have issues with a "jaw alignment issue." She has been having more neck pain and pain in her muscles in her neck and around her ears. She has a mouth guard, but was not wearing it consistently- the past couple of times she has worn it she got a major headache after wearing it. She is otherwise feeling well with no other concerns or complaints at this time.   Relevant past medical, surgical, family and social history reviewed and updated as indicated. Interim medical history since our last visit reviewed. Allergies and medications reviewed and updated.  Review of Systems  Constitutional: Negative.   HENT: Positive for ear pain, facial swelling, sinus pressure and voice change. Negative for congestion, dental problem, drooling, ear discharge, hearing loss, mouth sores, nosebleeds, postnasal drip, rhinorrhea, sinus pain, sneezing, sore throat, tinnitus and trouble swallowing.   Eyes: Negative.    Respiratory: Negative.   Cardiovascular: Negative.   Neurological: Negative.   Psychiatric/Behavioral: Negative.     Per HPI unless specifically indicated above     Objective:    BP (!) 147/86   Pulse 83   Temp 98.6 F (37 C)   SpO2 96%   Wt Readings from Last 3 Encounters:  01/14/19 242 lb (109.8 kg)  01/06/19 245 lb (111.1 kg)  11/06/18 242 lb (109.8 kg)    Physical Exam Vitals signs and nursing note reviewed.  Constitutional:      General: She is not in acute distress.    Appearance: Normal appearance. She is not ill-appearing, toxic-appearing or diaphoretic.  HENT:     Head: Normocephalic and atraumatic.     Right Ear: External ear normal.     Left Ear: External ear normal.     Nose: Nose normal.     Mouth/Throat:     Mouth: Mucous membranes are moist.     Pharynx: Oropharynx is clear.  Eyes:     General: No scleral icterus.       Right eye: No discharge.        Left eye: No discharge.     Extraocular Movements: Extraocular movements intact.     Conjunctiva/sclera: Conjunctivae normal.     Pupils: Pupils are equal, round, and reactive to light.  Neck:     Musculoskeletal: Normal range of motion  and neck supple.  Cardiovascular:     Rate and Rhythm: Normal rate and regular rhythm.     Pulses: Normal pulses.     Heart sounds: Normal heart sounds. No murmur. No friction rub. No gallop.   Pulmonary:     Effort: Pulmonary effort is normal. No respiratory distress.     Breath sounds: Normal breath sounds. No stridor. No wheezing, rhonchi or rales.  Chest:     Chest wall: No tenderness.  Musculoskeletal: Normal range of motion.  Skin:    General: Skin is warm and dry.     Capillary Refill: Capillary refill takes less than 2 seconds.     Coloration: Skin is not jaundiced or pale.     Findings: No bruising, erythema, lesion or rash.  Neurological:     General: No focal deficit present.     Mental Status: She is alert and oriented to person, place, and time.  Mental status is at baseline.  Psychiatric:        Mood and Affect: Mood normal.        Behavior: Behavior normal.        Thought Content: Thought content normal.        Judgment: Judgment normal.     Results for orders placed or performed during the hospital encounter of 01/14/19  Pregnancy, urine POC  Result Value Ref Range   Preg Test, Ur NEGATIVE NEGATIVE      Assessment & Plan:   Problem List Items Addressed This Visit      Musculoskeletal and Integument   TMJ (temporomandibular joint disorder)    Will hook back in with her endodontist and her dentist. Information about TMJ specialist provided today. Call with any concerns. Continue to monitor.         Other   Change in voice - Primary    Will hook back up with [facility] ENT and speech therapy. Continue to monitor. Call with any concerns.           Follow up plan: Return in about 3 months (around 07/16/2019) for physical.

## 2019-05-16 ENCOUNTER — Encounter: Payer: Self-pay | Admitting: Family Medicine

## 2019-05-16 ENCOUNTER — Ambulatory Visit (INDEPENDENT_AMBULATORY_CARE_PROVIDER_SITE_OTHER): Payer: BC Managed Care – PPO | Admitting: Family Medicine

## 2019-05-16 ENCOUNTER — Other Ambulatory Visit: Payer: Self-pay

## 2019-05-16 DIAGNOSIS — M26609 Unspecified temporomandibular joint disorder, unspecified side: Secondary | ICD-10-CM

## 2019-05-16 DIAGNOSIS — G43009 Migraine without aura, not intractable, without status migrainosus: Secondary | ICD-10-CM | POA: Diagnosis not present

## 2019-05-16 MED ORDER — ONDANSETRON 4 MG PO TBDP: 4.0000 mg | ORAL_TABLET | Freq: Three times a day (TID) | ORAL | 0 refills | Status: DC | PRN

## 2019-05-16 MED ORDER — CYCLOBENZAPRINE HCL 10 MG PO TABS: 10.0000 mg | ORAL_TABLET | Freq: Every day | ORAL | 0 refills | Status: DC

## 2019-05-16 NOTE — Assessment & Plan Note (Signed)
In exacerbation. Will treat with flexeril. Call if not getting any better or getting worse. Continue to monitor.

## 2019-05-16 NOTE — Progress Notes (Signed)
There were no vitals taken for this visit.   Subjective:    Patient ID: Teresa Peters, female    DOB: 01-Oct-1970, 48 y.o.   MRN: [id number]  HPI: Teresa Peters is a 48 y.o. female  Chief Complaint  Patient presents with  . Headache    and nausea, some stiff muscles in her jaw and neck which is chronic but feels like it is worse   [name] presents today with pain in her head. She notes that last week she had her period and had a migraine. She states that this sometimes happens, and it hasn't been anything particularly that has bothered her. She started with a headache that lasted about a day. She notes that she had nausea with the headache, but then the headache went away and the nausea has continued. She has had a slightly decreased appetite. Does not feel well when she has an empty stomach. She has been having some dizziness. She is otherwise feeling well with no other concerns or complaints at this time.   Relevant past medical, surgical, family and social history reviewed and updated as indicated. Interim medical history since our last visit reviewed. Allergies and medications reviewed and updated.  Review of Systems  Constitutional: Negative.   Respiratory: Negative.   Cardiovascular: Negative.   Gastrointestinal: Positive for nausea. Negative for abdominal distention, abdominal pain, anal bleeding, blood in stool, constipation, diarrhea, rectal pain and vomiting.  Musculoskeletal: Positive for myalgias, neck pain and neck stiffness. Negative for arthralgias, back pain, gait problem and joint swelling.  Skin: Negative.   Neurological: Positive for headaches. Negative for dizziness, tremors, seizures, syncope, facial asymmetry, speech difficulty, weakness, light-headedness and numbness.  Psychiatric/Behavioral: Negative.     Per HPI unless specifically indicated above     Objective:    There were no vitals taken for this visit.  Wt Readings from Last 3 Encounters:   01/14/19 242 lb (109.8 kg)  01/06/19 245 lb (111.1 kg)  11/06/18 242 lb (109.8 kg)    Physical Exam Vitals signs and nursing note reviewed.  Constitutional:      General: She is not in acute distress.    Appearance: Normal appearance. She is not ill-appearing, toxic-appearing or diaphoretic.  HENT:     Head: Normocephalic and atraumatic.     Right Ear: External ear normal.     Left Ear: External ear normal.     Nose: Nose normal.     Mouth/Throat:     Mouth: Mucous membranes are moist.     Pharynx: Oropharynx is clear.  Eyes:     General: No scleral icterus.       Right eye: No discharge.        Left eye: No discharge.     Conjunctiva/sclera: Conjunctivae normal.     Pupils: Pupils are equal, round, and reactive to light.  Neck:     Musculoskeletal: Normal range of motion.  Pulmonary:     Effort: Pulmonary effort is normal. No respiratory distress.     Comments: Speaking in full sentences Musculoskeletal: Normal range of motion.  Skin:    Coloration: Skin is not jaundiced or pale.     Findings: No bruising, erythema, lesion or rash.  Neurological:     Mental Status: She is alert and oriented to person, place, and time. Mental status is at baseline.  Psychiatric:        Mood and Affect: Mood normal.        Behavior: Behavior normal.  Thought Content: Thought content normal.        Judgment: Judgment normal.     Results for orders placed or performed during the hospital encounter of 01/14/19  Pregnancy, urine POC  Result Value Ref Range   Preg Test, Ur NEGATIVE NEGATIVE      Assessment & Plan:   Problem List Items Addressed This Visit      Musculoskeletal and Integument   TMJ (temporomandibular joint disorder)    In exacerbation. Will treat with flexeril. Call if not getting any better or getting worse. Continue to monitor.       Other Visit Diagnoses    Migraine without aura and without status migrainosus, not intractable    -  Primary   Resolved now.  Will treat with zofran and flexeril. Call if not getting better. Continue to monitor.    Relevant Medications   cyclobenzaprine (FLEXERIL) 10 MG tablet       Follow up plan: Return if symptoms worsen or fail to improve.    . This visit was completed via [employer] due to the restrictions of the COVID-19 pandemic. All issues as above were discussed and addressed. Physical exam was done as above through visual confirmation on D[employer]imity. If it was felt that the patient should be evaluated in the office, they were directed there. The patient verbally consented to this visit. . Location of the patient: home . Location of the provider: home . Those involved with this call:  . Provider: Olevia Perches, DO . CMA: Tiffany Reel, CMA . Front Desk/Registration: Adela Ports  . Time spent on call: 15 minutes with patient face to face via video conference. More than 50% of this time was spent in counseling and coordination of care. 23 minutes total spent in review of patient's record and preparation of their chart.

## 2019-07-22 ENCOUNTER — Ambulatory Visit: Payer: BC Managed Care – PPO | Admitting: Family Medicine

## 2019-07-22 ENCOUNTER — Encounter: Payer: Self-pay | Admitting: Family Medicine

## 2019-07-22 ENCOUNTER — Telehealth (INDEPENDENT_AMBULATORY_CARE_PROVIDER_SITE_OTHER): Payer: BC Managed Care – PPO | Admitting: Family Medicine

## 2019-07-22 DIAGNOSIS — R1011 Right upper quadrant pain: Secondary | ICD-10-CM

## 2019-07-22 NOTE — Progress Notes (Signed)
There were no vitals taken for this visit.   Subjective:    Patient ID: Teresa Peters, female    DOB: [birth year], 49 y.o.   MRN: [id number]  HPI: Teresa Peters is a 49 y.o. female  Chief Complaint  Patient presents with  . Follow-up   Reina notes that she used the muscle relaxer 2x and the zofran 1x. After that, her nausea got better, but she started with some pain in her RUQ. She has changed her diet and has been been taking a gall bladder supplement. She notes that she was constipated around that time, but is feeling much better now. She notes that she is feeling much better. She has been working on her diet to try to help lose weight. She is working on watching some videos on youtube to see how her sugars are going. She is also working with a Scientific laboratory technician. She is working on trying to get some exercise. She notes that occasionally she has some trouble swallowing her supplements as they are large capsules. She notes that she occasionally gets some cramps in her legs. She feels like her thyroid is doing what it's supposed to. She is otherwise doing well with no other concerns or complaints at this time.   Relevant past medical, surgical, family and social history reviewed and updated as indicated. Interim medical history since our last visit reviewed. Allergies and medications reviewed and updated.  Review of Systems  Constitutional: Negative.   Respiratory: Negative.   Cardiovascular: Negative.   Gastrointestinal: Negative.   Musculoskeletal: Negative.   Psychiatric/Behavioral: Negative.     Per HPI unless specifically indicated above     Objective:    There were no vitals taken for this visit.  Wt Readings from Last 3 Encounters:  01/14/19 242 lb (109.8 kg)  01/06/19 245 lb (111.1 kg)  11/06/18 242 lb (109.8 kg)    Physical Exam Vitals and nursing note reviewed.  Constitutional:      General: She is not in acute distress.    Appearance: Normal appearance. She is  not ill-appearing, toxic-appearing or diaphoretic.  HENT:     Head: Normocephalic and atraumatic.     Right Ear: External ear normal.     Left Ear: External ear normal.     Nose: Nose normal.     Mouth/Throat:     Mouth: Mucous membranes are moist.     Pharynx: Oropharynx is clear.  Eyes:     General: No scleral icterus.       Right eye: No discharge.        Left eye: No discharge.     Conjunctiva/sclera: Conjunctivae normal.     Pupils: Pupils are equal, round, and reactive to light.  Pulmonary:     Effort: Pulmonary effort is normal. No respiratory distress.     Comments: Speaking in full sentences Musculoskeletal:        General: Normal range of motion.     Cervical back: Normal range of motion.  Skin:    Coloration: Skin is not jaundiced or pale.     Findings: No bruising, erythema, lesion or rash.  Neurological:     Mental Status: She is alert and oriented to person, place, and time. Mental status is at baseline.  Psychiatric:        Mood and Affect: Mood normal.        Behavior: Behavior normal.        Thought Content: Thought content normal.  Judgment: Judgment normal.     Results for orders placed or performed during the hospital encounter of 01/14/19  Pregnancy, urine POC  Result Value Ref Range   Preg Test, Ur NEGATIVE NEGATIVE      Assessment & Plan:   Problem List Items Addressed This Visit    None    Visit Diagnoses    RUQ pain    -  Primary   History of sludge in the gall bladder. Does not want to have CCY at this time. Will continue to monitor and call if getting worse. Continue to monitor.        Follow up plan: Return ASAP Physical.   . This visit was completed via mychart due to the restrictions of the COVID-19 pandemic. All issues as above were discussed and addressed. Physical exam was done as above through visual confirmation on mychart. If it was felt that the patient should be evaluated in the office, they were directed there. The  patient verbally consented to this visit. . Location of the patient: home . Location of the provider: work . Those involved with this call:  . Provider: Park Liter, DO . CMA: Yvonna Alanis, [place] . Front Desk/Registration: Don Perking  . Time spent on call: 15 minutes with patient face to face via video conference. More than 50% of this time was spent in counseling and coordination of care. 23 minutes total spent in review of patient's record and preparation of their chart.

## 2019-09-04 ENCOUNTER — Other Ambulatory Visit: Payer: Self-pay

## 2019-09-04 ENCOUNTER — Ambulatory Visit (INDEPENDENT_AMBULATORY_CARE_PROVIDER_SITE_OTHER): Payer: BC Managed Care – PPO | Admitting: Family Medicine

## 2019-09-04 ENCOUNTER — Encounter: Payer: Self-pay | Admitting: Family Medicine

## 2019-09-04 VITALS — BP 155/85 | HR 80 | Temp 98.1°F

## 2019-09-04 DIAGNOSIS — R7303 Prediabetes: Secondary | ICD-10-CM

## 2019-09-04 DIAGNOSIS — Z0000 Encounter for general adult medical examination without abnormal findings: Secondary | ICD-10-CM

## 2019-09-04 DIAGNOSIS — R8281 Pyuria: Secondary | ICD-10-CM

## 2019-09-04 DIAGNOSIS — E039 Hypothyroidism, unspecified: Secondary | ICD-10-CM | POA: Diagnosis not present

## 2019-09-04 DIAGNOSIS — R079 Chest pain, unspecified: Secondary | ICD-10-CM | POA: Diagnosis not present

## 2019-09-04 DIAGNOSIS — E559 Vitamin D deficiency, unspecified: Secondary | ICD-10-CM | POA: Diagnosis not present

## 2019-09-04 DIAGNOSIS — E6609 Other obesity due to excess calories: Secondary | ICD-10-CM | POA: Diagnosis not present

## 2019-09-04 DIAGNOSIS — M2550 Pain in unspecified joint: Secondary | ICD-10-CM

## 2019-09-04 DIAGNOSIS — R1310 Dysphagia, unspecified: Secondary | ICD-10-CM

## 2019-09-04 DIAGNOSIS — E538 Deficiency of other specified B group vitamins: Secondary | ICD-10-CM

## 2019-09-04 DIAGNOSIS — D649 Anemia, unspecified: Secondary | ICD-10-CM | POA: Diagnosis not present

## 2019-09-04 DIAGNOSIS — E119 Type 2 diabetes mellitus without complications: Secondary | ICD-10-CM

## 2019-09-04 NOTE — Progress Notes (Signed)
BP (!) 155/85 (BP Location: Left Arm, Cuff Size: Normal)   Pulse 80   Temp 98.1 F (36.7 C) (Oral)   SpO2 100%    Subjective:    Patient ID: Teresa Peters, female    DOB: [birth year], 49 y.o.   MRN: [id number]  HPI: Teresa Peters is a 49 y.o. female presenting on 09/04/2019 for comprehensive medical examination. Current medical complaints include:  Has not been feeling well. She had some vertigo earlier in the week. It's now resolved. She feels like her legs are aching and has been having some swelling in her upper leg. She has been having some joint pains for about a couple of weeks. She has been working on trying to get on the treadmill. She notes that she is concerned about having blood clots in her legs. She states that it is more of an aching in her leg with some cramps. She has not been exercising. She is concerned that walking on the treadmill is going to cause a thyroid storm. She notes that sometimes when she swallows her supplements she feels like her pills get caught in her chest.   CHEST PAIN Duration:yesterday PM Onset: sudden Quality: spasmy, pressure Severity: moderate Location: upper sternum Radiation: epigastic Episode duration: constant with extra spasms on top Frequency: constant Related to exertion: yes Activity when pain started: a while after swallowing her pills, exercising Trauma: no Anxiety/recent stressors: yes Status: fluctuating Treatments attempted: water- didn't help at all    Current pain status: in pain Shortness of breath: no Cough: no Nausea: no Diaphoresis: no Heartburn: no Palpitations: yes   She also notes that she had a pain on the L side of her back seemed to come on when she was moving. Hurts when she pushes on it, not radiating. Better with not pushing on it.   Impaired Fasting Glucose HbA1C:  Lab Results  Component Value Date   HGBA1C 6.5 09/04/2019   Duration of elevated blood sugar: chronic Polydipsia: no Polyuria:  no Weight change: yes Visual disturbance: no Glucose Monitoring: no    Accucheck frequency: Not Checking Diabetic Education: Completed Family history of diabetes: yes  HYPOTHYROIDISM Thyroid control status:stable Satisfied with current treatment? yes Medication side effects: no Medication compliance: excellent compliance Etiology of hypothyroidism:  Recent dose adjustment:no Fatigue: yes Cold intolerance: no Heat intolerance: no Weight gain: yes Weight loss: no Constipation: no Diarrhea/loose stools: no Palpitations: yes Lower extremity edema: yes Anxiety/depressed mood: no  She currently lives with: Menopausal Symptoms: no  Depression Screen done today and results listed below:  Depression screen [facility] 2/9 09/05/2019 01/06/2019  Decreased Interest 3 1  Down, Depressed, Hopeless 3 1  PHQ - 2 Score 6 2  Altered sleeping 3 0  Tired, decreased energy 1 1  Change in appetite 2 1  Feeling bad or failure about yourself  3 1  Trouble concentrating 1 0  Moving slowly or fidgety/restless 2 0  Suicidal thoughts 1 0  PHQ-9 Score 19 5  Difficult doing work/chores Somewhat difficult Not difficult at all     Past Medical History:  Past Medical History:  Diagnosis Date  . Anemia   . Cellulitis   . Diabetes mellitus without complication (HCC)   . Fatty liver   . Hypothyroidism   . Obesity   . Prediabetes     Surgical History:  Past Surgical History:  Procedure Laterality Date  . ADENOIDECTOMY    . ADENOIDECTOMY    . COLONOSCOPY WITH PROPOFOL N/A  01/14/2019   Procedure: COLONOSCOPY WITH PROPOFOL;  Surgeon: Pasty Spillers, MD;  Location: [facility];  Service: Endoscopy;  Laterality: N/A;  . ESOPHAGOGASTRODUODENOSCOPY  2012   polyps (pt states they were not removed)  . ESOPHAGOGASTRODUODENOSCOPY (EGD) WITH PROPOFOL N/A 01/14/2019   Procedure: ESOPHAGOGASTRODUODENOSCOPY (EGD) WITH BIOPSY;  Surgeon: Pasty Spillers, MD;  Location: [facility];   Service: Endoscopy;  Laterality: N/A;  . FOOT SURGERY    . POLYPECTOMY N/A 01/14/2019   Procedure: POLYPECTOMY;  Surgeon: Pasty Spillers, MD;  Location: [facility];  Service: Endoscopy;  Laterality: N/A;  Stomach    Medications:  Current Outpatient Medications on File Prior to Visit  Medication Sig  . B Complex Vitamins (VITAMIN B COMPLEX PO) Take by mouth daily.  . Menaquinone-7 (VITAMIN K2 PO) Take by mouth daily.  Marland Kitchen thyroid (ARMOUR) 90 MG tablet Take 90 mg by mouth daily.  Marland Kitchen VITAMIN D PO Take by mouth daily.  . Ferrous Sulfate (IRON PO) Take by mouth daily.   No current facility-administered medications on file prior to visit.    Allergies:  Allergies  Allergen Reactions  . Metformin And Related     Per previous provider's notes    Social History:  Social History   Socioeconomic History  . Marital status: Married    Spouse name: Not on file  . Number of children: Not on file  . Years of education: Not on file  . Highest education level: Not on file  Occupational History  . Not on file  Tobacco Use  . Smoking status: Never Smoker  . Smokeless tobacco: Never Used  Substance and Sexual Activity  . Alcohol use: Yes    Comment: rare - 1x/month  . Drug use: No  . Sexual activity: Yes    Birth control/protection: None  Other Topics Concern  . Not on file  Social History Narrative  . Not on file   Social Determinants of Health   Financial Resource Strain:   . Difficulty of Paying Living Expenses: Not on file  Food Insecurity:   . Worried About Programme researcher, broadcasting/film/video in the Last Year: Not on file  . Ran Out of Food in the Last Year: Not on file  Transportation Needs:   . Lack of Transportation (Medical): Not on file  . Lack of Transportation (Non-Medical): Not on file  Physical Activity:   . Days of Exercise per Week: Not on file  . Minutes of Exercise per Session: Not on file  Stress:   . Feeling of Stress : Not on file  Social Connections:   .  Frequency of Communication with Friends and Family: Not on file  . Frequency of Social Gatherings with Friends and Family: Not on file  . Attends Religious Services: Not on file  . Active Member of Clubs or Organizations: Not on file  . Attends Banker Meetings: Not on file  . Marital Status: Not on file  Intimate Partner Violence:   . Fear of Current or Ex-Partner: Not on file  . Emotionally Abused: Not on file  . Physically Abused: Not on file  . Sexually Abused: Not on file   Social History   Tobacco Use  Smoking Status Never Smoker  Smokeless Tobacco Never Used   Social History   Substance and Sexual Activity  Alcohol Use Yes   Comment: rare - 1x/month    Family History:  Family History  Problem Relation Age of Onset  .  Glaucoma Mother   . Cataracts Mother   . Diabetes Mellitus II Paternal Grandfather   . Diabetes Paternal Grandfather   . Hypertension Father   . Fibromyalgia Sister   . Obesity Sister   . Thyroid disease Son   . Raynaud syndrome Paternal Grandmother     Past medical history, surgical history, medications, allergies, family history and social history reviewed with patient today and changes made to appropriate areas of the chart.   Review of Systems  Constitutional: Positive for chills. Negative for diaphoresis, fever, malaise/fatigue and weight loss.  HENT: Positive for tinnitus. Negative for congestion, ear discharge, ear pain, hearing loss, nosebleeds, sinus pain and sore throat.   Eyes: Negative.   Respiratory: Negative.  Negative for stridor.   Cardiovascular: Positive for chest pain and leg swelling. Negative for palpitations, orthopnea, claudication and PND.  Gastrointestinal: Positive for constipation and diarrhea. Negative for abdominal pain, blood in stool, heartburn, melena, nausea and vomiting.  Genitourinary: Negative.   Musculoskeletal: Positive for joint pain and myalgias. Negative for back pain, falls and neck pain.    Skin: Negative.   Neurological: Positive for dizziness and tingling. Negative for tremors, sensory change, speech change, focal weakness, seizures, loss of consciousness, weakness and headaches.  Endo/Heme/Allergies: Negative.   Psychiatric/Behavioral: Negative for depression, hallucinations, memory loss, substance abuse and suicidal ideas. The patient is nervous/anxious. The patient does not have insomnia.     All other ROS negative except what is listed above and in the HPI.      Objective:    BP (!) 155/85 (BP Location: Left Arm, Cuff Size: Normal)   Pulse 80   Temp 98.1 F (36.7 C) (Oral)   SpO2 100%   Wt Readings from Last 3 Encounters:  01/14/19 242 lb (109.8 kg)  01/06/19 245 lb (111.1 kg)  11/06/18 242 lb (109.8 kg)    Physical Exam Vitals and nursing note reviewed.  Constitutional:      General: She is not in acute distress.    Appearance: Normal appearance. She is not ill-appearing, toxic-appearing or diaphoretic.  HENT:     Head: Normocephalic and atraumatic.     Right Ear: Tympanic membrane, ear canal and external ear normal. There is no impacted cerumen.     Left Ear: Tympanic membrane, ear canal and external ear normal. There is no impacted cerumen.     Nose: Nose normal. No congestion or rhinorrhea.     Mouth/Throat:     Mouth: Mucous membranes are moist.     Pharynx: Oropharynx is clear. No oropharyngeal exudate or posterior oropharyngeal erythema.  Eyes:     General: No scleral icterus.       Right eye: No discharge.        Left eye: No discharge.     Extraocular Movements: Extraocular movements intact.     Conjunctiva/sclera: Conjunctivae normal.     Pupils: Pupils are equal, round, and reactive to light.  Neck:     Vascular: No carotid bruit.  Cardiovascular:     Rate and Rhythm: Normal rate and regular rhythm.     Pulses: Normal pulses.     Heart sounds: No murmur. No friction rub. No gallop.   Pulmonary:     Effort: Pulmonary effort is normal. No  respiratory distress.     Breath sounds: Normal breath sounds. No stridor. No wheezing, rhonchi or rales.  Chest:     Chest wall: No tenderness.  Abdominal:     General: Abdomen is flat. Bowel  sounds are normal. There is no distension.     Palpations: Abdomen is soft. There is no mass.     Tenderness: There is no abdominal tenderness. There is no right CVA tenderness, left CVA tenderness, guarding or rebound.     Hernia: No hernia is present.  Genitourinary:    Comments: Breast and pelvic exams deferred with shared decision making Musculoskeletal:        General: No swelling, tenderness, deformity or signs of injury.     Cervical back: Normal range of motion and neck supple. No rigidity. No muscular tenderness.     Right lower leg: No edema.     Left lower leg: No edema.  Lymphadenopathy:     Cervical: No cervical adenopathy.  Skin:    General: Skin is warm and dry.     Capillary Refill: Capillary refill takes less than 2 seconds.     Coloration: Skin is not jaundiced or pale.     Findings: No bruising, erythema, lesion or rash.  Neurological:     General: No focal deficit present.     Mental Status: She is alert and oriented to person, place, and time. Mental status is at baseline.     Cranial Nerves: No cranial nerve deficit.     Sensory: No sensory deficit.     Motor: No weakness.     Coordination: Coordination normal.     Gait: Gait normal.     Deep Tendon Reflexes: Reflexes normal.  Psychiatric:        Mood and Affect: Mood normal.        Behavior: Behavior normal.        Thought Content: Thought content normal.        Judgment: Judgment normal.     Results for orders placed or performed in visit on 09/04/19  Microscopic Examination   BLD  Result Value Ref Range   WBC, UA 0-5 0 - 5 /hpf   RBC None seen 0 - 2 /hpf   Epithelial Cells (non renal) 0-10 0 - 10 /hpf   Bacteria, UA None seen None seen/Few  Urine Culture, Reflex   BLD  Result Value Ref Range   Urine  Culture, Routine Final report (A)    Organism ID, Bacteria Comment (A)   Bayer DCA Hb A1c Waived  Result Value Ref Range   HB A1C (BAYER DCA - WAIVED) 6.5 <7.0 %  CBC with Differential/Platelet  Result Value Ref Range   WBC 9.0 3.4 - 10.8 x10E3/uL   RBC 4.91 3.77 - 5.28 x10E6/uL   Hemoglobin 13.8 11.1 - 15.9 g/dL   Hematocrit 16.141.3 09.634.0 - 46.6 %   MCV 84 79 - 97 fL   MCH 28.1 26.6 - 33.0 pg   MCHC 33.4 31.5 - 35.7 g/dL   RDW 04.513.3 40.911.7 - 81.115.4 %   Platelets 274 150 - 450 x10E3/uL   Neutrophils 64 Not Estab. %   Lymphs 27 Not Estab. %   Monocytes 5 Not Estab. %   Eos 3 Not Estab. %   Basos 1 Not Estab. %   Neutrophils Absolute 5.8 1.4 - 7.0 x10E3/uL   Lymphocytes Absolute 2.5 0.7 - 3.1 x10E3/uL   Monocytes Absolute 0.5 0.1 - 0.9 x10E3/uL   EOS (ABSOLUTE) 0.3 0.0 - 0.4 x10E3/uL   Basophils Absolute 0.1 0.0 - 0.2 x10E3/uL   Immature Granulocytes 0 Not Estab. %   Immature Grans (Abs) 0.0 0.0 - 0.1 x10E3/uL  Comprehensive metabolic panel  Result Value  Ref Range   Glucose 97 65 - 99 mg/dL   BUN 14 6 - 24 mg/dL   Creatinine, Ser 3.53 0.57 - 1.00 mg/dL   GFR calc non Af Amer 83 >59 mL/min/1.73   GFR calc Af Amer 96 >59 mL/min/1.73   BUN/Creatinine Ratio 17 9 - 23   Sodium 139 134 - 144 mmol/L   Potassium 4.4 3.5 - 5.2 mmol/L   Chloride 103 96 - 106 mmol/L   CO2 22 20 - 29 mmol/L   Calcium 9.5 8.7 - 10.2 mg/dL   Total Protein 7.1 6.0 - 8.5 g/dL   Albumin 4.4 3.8 - 4.8 g/dL   Globulin, Total 2.7 1.5 - 4.5 g/dL   Albumin/Globulin Ratio 1.6 1.2 - 2.2   Bilirubin Total 0.3 0.0 - 1.2 mg/dL   Alkaline Phosphatase 80 39 - 117 IU/L   AST 24 0 - 40 IU/L   ALT 20 0 - 32 IU/L  Lipid Panel w/o Chol/HDL Ratio  Result Value Ref Range   Cholesterol, Total 206 (H) 100 - 199 mg/dL   Triglycerides 98 0 - 149 mg/dL   HDL 54 >61 mg/dL   VLDL Cholesterol Cal 18 5 - 40 mg/dL   LDL Chol Calc (NIH) 443 (H) 0 - 99 mg/dL  TSH  Result Value Ref Range   TSH 1.620 0.450 - 4.500 uIU/mL  UA/M w/rflx  Culture, Routine   Specimen: Blood   BLD  Result Value Ref Range   Specific Gravity, UA 1.010 1.005 - 1.030   pH, UA 5.5 5.0 - 7.5   Color, UA Yellow Yellow   Appearance Ur Clear Clear   Leukocytes,UA Trace (A) Negative   Protein,UA Negative Negative/Trace   Glucose, UA Negative Negative   Ketones, UA 1+ (A) Negative   RBC, UA Negative Negative   Bilirubin, UA Negative Negative   Urobilinogen, Ur 0.2 0.2 - 1.0 mg/dL   Nitrite, UA Negative Negative   Microscopic Examination See below:    Urinalysis Reflex Comment   VITAMIN D 25 Hydroxy (Vit-D Deficiency, Fractures)  Result Value Ref Range   Vit D, 25-Hydroxy 80.2 30.0 - 100.0 ng/mL  B12  Result Value Ref Range   Vitamin B-12 594 232 - 1,245 pg/mL  C-reactive protein  Result Value Ref Range   CRP 5 0 - 10 mg/L      Assessment & Plan:   Problem List Items Addressed This Visit      Endocrine   Acquired hypothyroidism    Checking labs today. Await results. Call with any concerns.       Relevant Orders   Comprehensive metabolic panel (Completed)   TSH (Completed)   Diet-controlled diabetes mellitus (HCC)    Newly diagnosed. Will work on diet and exercise. Offered referral to lifestyle center- she will hold at this time. Would like to hold on ACE and statin. Continue to monitor closely. Call with any concerns.         Other   Mild anemia    Checking labs today. Await results. Call with any concerns.       Relevant Orders   CBC with Differential/Platelet (Completed)   Comprehensive metabolic panel (Completed)   Obesity   Relevant Orders   Comprehensive metabolic panel (Completed)   Vitamin B 12 deficiency    Checking labs today. Await results. Call with any concerns.       Relevant Orders   Comprehensive metabolic panel (Completed)   B12 (Completed)   Vitamin D deficiency  Checking labs today. Await results. Call with any concerns.       Relevant Orders   Comprehensive metabolic panel (Completed)    VITAMIN D 25 Hydroxy (Vit-D Deficiency, Fractures) (Completed)   RESOLVED: Pre-diabetes   Relevant Orders   Bayer DCA Hb A1c Waived (Completed)   Comprehensive metabolic panel (Completed)   Lipid Panel w/o Chol/HDL Ratio (Completed)    Other Visit Diagnoses    Routine general medical examination at a health care facility    -  Primary   Vaccines up to date. Screening labs checked today. Pap up to date. Mammogram up to date. Continue diet and exercise. Call with any concerns.    Relevant Orders   UA/M w/rflx Culture, Routine (Completed)   Arthralgia, unspecified joint       Checking labs today. Await results. Call with any concerns.    Relevant Orders   C-reactive protein   Pill dysphagia       Has seen GI- likely due to esophageal spasm. Call if not getting better or getting worse.    Chest pain, unspecified type       EKG normal. Discussed referral to cardiology- she will consider. Likely esophageal spasm. Call with any concerns.    Relevant Orders   EKG 12-Lead (Completed)       Follow up plan: Return in about 4 weeks (around 10/02/2019).   LABORATORY TESTING:  - Pap smear: up to date  IMMUNIZATIONS:   - Tdap: Tetanus vaccination status reviewed: Refused. - Influenza: Refused - Pneumovax: Refused  SCREENING: -Mammogram: Up to date  - Colonoscopy: Up to date `  PATIENT COUNSELING:   Advised to take 1 mg of folate supplement per day if capable of pregnancy.   Sexuality: Discussed sexually transmitted diseases, partner selection, use of condoms, avoidance of unintended pregnancy  and contraceptive alternatives.   Advised to avoid cigarette smoking.  I discussed with the patient that most people either abstain from alcohol or drink within safe limits (<=14/week and <=4 drinks/occasion for males, <=7/weeks and <= 3 drinks/occasion for females) and that the risk for alcohol disorders and other health effects rises proportionally with the number of drinks per week and how  often a drinker exceeds daily limits.  Discussed cessation/primary prevention of drug use and availability of treatment for abuse.   Diet: Encouraged to adjust caloric intake to maintain  or achieve ideal body weight, to reduce intake of dietary saturated fat and total fat, to limit sodium intake by avoiding high sodium foods and not adding table salt, and to maintain adequate dietary potassium and calcium preferably from fresh fruits, vegetables, and low-fat dairy products.    stressed the importance of regular exercise  Injury prevention: Discussed safety belts, safety helmets, smoke detector, smoking near bedding or upholstery.   Dental health: Discussed importance of regular tooth brushing, flossing, and dental visits.    NEXT PREVENTATIVE PHYSICAL DUE IN 1 YEAR. Return in about 4 weeks (around 10/02/2019).

## 2019-09-05 LAB — VITAMIN D 25 HYDROXY (VIT D DEFICIENCY, FRACTURES): Vit D, 25-Hydroxy: 80.2 ng/mL (ref 30.0–100.0)

## 2019-09-05 LAB — VITAMIN B12: Vitamin B-12: 594 pg/mL (ref 232–1245)

## 2019-09-05 LAB — CBC WITH DIFFERENTIAL/PLATELET
Basophils Absolute: 0.1 10*3/uL (ref 0.0–0.2)
Basos: 1 %
EOS (ABSOLUTE): 0.3 10*3/uL (ref 0.0–0.4)
Eos: 3 %
Hematocrit: 41.3 % (ref 34.0–46.6)
Hemoglobin: 13.8 g/dL (ref 11.1–15.9)
Immature Grans (Abs): 0 10*3/uL (ref 0.0–0.1)
Immature Granulocytes: 0 %
Lymphocytes Absolute: 2.5 10*3/uL (ref 0.7–3.1)
Lymphs: 27 %
MCH: 28.1 pg (ref 26.6–33.0)
MCHC: 33.4 g/dL (ref 31.5–35.7)
MCV: 84 fL (ref 79–97)
Monocytes Absolute: 0.5 10*3/uL (ref 0.1–0.9)
Monocytes: 5 %
Neutrophils Absolute: 5.8 10*3/uL (ref 1.4–7.0)
Neutrophils: 64 %
Platelets: 274 10*3/uL (ref 150–450)
RBC: 4.91 x10E6/uL (ref 3.77–5.28)
RDW: 13.3 % (ref 11.7–15.4)
WBC: 9 10*3/uL (ref 3.4–10.8)

## 2019-09-05 LAB — COMPREHENSIVE METABOLIC PANEL
ALT: 20 IU/L (ref 0–32)
AST: 24 IU/L (ref 0–40)
Albumin/Globulin Ratio: 1.6 (ref 1.2–2.2)
Albumin: 4.4 g/dL (ref 3.8–4.8)
Alkaline Phosphatase: 80 IU/L (ref 39–117)
BUN/Creatinine Ratio: 17 (ref 9–23)
BUN: 14 mg/dL (ref 6–24)
Bilirubin Total: 0.3 mg/dL (ref 0.0–1.2)
CO2: 22 mmol/L (ref 20–29)
Calcium: 9.5 mg/dL (ref 8.7–10.2)
Chloride: 103 mmol/L (ref 96–106)
Creatinine, Ser: 0.83 mg/dL (ref 0.57–1.00)
GFR calc Af Amer: 96 mL/min/{1.73_m2} (ref >59)
GFR calc non Af Amer: 83 mL/min/{1.73_m2} (ref >59)
Globulin, Total: 2.7 g/dL (ref 1.5–4.5)
Glucose: 97 mg/dL (ref 65–99)
Potassium: 4.4 mmol/L (ref 3.5–5.2)
Sodium: 139 mmol/L (ref 134–144)
Total Protein: 7.1 g/dL (ref 6.0–8.5)

## 2019-09-05 LAB — LIPID PANEL W/O CHOL/HDL RATIO
Cholesterol, Total: 206 mg/dL — ABNORMAL HIGH (ref 100–199)
HDL: 54 mg/dL (ref >39)
LDL Chol Calc (NIH): 134 mg/dL — ABNORMAL HIGH (ref 0–99)
Triglycerides: 98 mg/dL (ref 0–149)
VLDL Cholesterol Cal: 18 mg/dL (ref 5–40)

## 2019-09-05 LAB — TSH: TSH: 1.62 u[IU]/mL (ref 0.450–4.500)

## 2019-09-05 LAB — C-REACTIVE PROTEIN: CRP: 5 mg/L (ref 0–10)

## 2019-09-06 ENCOUNTER — Encounter: Payer: Self-pay | Admitting: Family Medicine

## 2019-09-06 DIAGNOSIS — E119 Type 2 diabetes mellitus without complications: Secondary | ICD-10-CM | POA: Insufficient documentation

## 2019-09-06 LAB — UA/M W/RFLX CULTURE, ROUTINE
Bilirubin, UA: NEGATIVE
Glucose, UA: NEGATIVE
Nitrite, UA: NEGATIVE
Protein,UA: NEGATIVE
RBC, UA: NEGATIVE
Specific Gravity, UA: 1.01 (ref 1.005–1.030)
Urobilinogen, Ur: 0.2 mg/dL (ref 0.2–1.0)
pH, UA: 5.5 (ref 5.0–7.5)

## 2019-09-06 LAB — MICROSCOPIC EXAMINATION
Bacteria, UA: NONE SEEN
RBC, Urine: NONE SEEN /hpf (ref 0–2)

## 2019-09-06 LAB — URINE CULTURE, REFLEX

## 2019-09-06 LAB — BAYER DCA HB A1C WAIVED: HB A1C (BAYER DCA - WAIVED): 6.5 % (ref <7.0)

## 2019-09-06 NOTE — Assessment & Plan Note (Signed)
Checking labs today. Await results. Call with any concerns.  

## 2019-09-06 NOTE — Assessment & Plan Note (Signed)
Newly diagnosed. Will work on diet and exercise. Offered referral to lifestyle center- she will hold at this time. Would like to hold on ACE and statin. Continue to monitor closely. Call with any concerns.

## 2019-10-31 ENCOUNTER — Encounter: Payer: Self-pay | Admitting: Family Medicine

## 2019-11-03 ENCOUNTER — Other Ambulatory Visit: Payer: Self-pay | Admitting: Family Medicine

## 2019-11-03 MED ORDER — THYROID 90 MG PO TABS: 90.0000 mg | ORAL_TABLET | Freq: Every day | ORAL | 2 refills | Status: DC

## 2019-11-06 ENCOUNTER — Encounter: Payer: Self-pay | Admitting: Family Medicine

## 2019-12-11 ENCOUNTER — Ambulatory Visit: Payer: BC Managed Care – PPO | Admitting: Family Medicine

## 2019-12-24 ENCOUNTER — Ambulatory Visit (INDEPENDENT_AMBULATORY_CARE_PROVIDER_SITE_OTHER): Payer: BC Managed Care – PPO | Admitting: Family Medicine

## 2019-12-24 ENCOUNTER — Encounter: Payer: Self-pay | Admitting: Family Medicine

## 2019-12-24 ENCOUNTER — Other Ambulatory Visit: Payer: Self-pay

## 2019-12-24 VITALS — BP 159/89 | HR 61 | Temp 97.6°F | Ht 65.75 in | Wt 244.0 lb

## 2019-12-24 DIAGNOSIS — I129 Hypertensive chronic kidney disease with stage 1 through stage 4 chronic kidney disease, or unspecified chronic kidney disease: Secondary | ICD-10-CM

## 2019-12-24 DIAGNOSIS — E119 Type 2 diabetes mellitus without complications: Secondary | ICD-10-CM

## 2019-12-24 LAB — MICROALBUMIN, URINE WAIVED
Creatinine, Urine Waived: 50 mg/dL (ref 10–300)
Microalb, Ur Waived: 10 mg/L (ref 0–19)

## 2019-12-24 LAB — BAYER DCA HB A1C WAIVED: HB A1C (BAYER DCA - WAIVED): 6.2 % (ref <7.0)

## 2019-12-24 MED ORDER — LISINOPRIL 5 MG PO TABS: 5.0000 mg | ORAL_TABLET | Freq: Every day | ORAL | 3 refills | Status: DC

## 2019-12-24 NOTE — Assessment & Plan Note (Signed)
Still running a little high, likely due to stress. Will start low dose lisinopril for renal protection and recheck 1 month. Call with any concerns.

## 2019-12-24 NOTE — Assessment & Plan Note (Addendum)
Doing well with A1c of 6.2 down from 6.5. Continue diet and exercise. Call with any concerns. Patient wants to hold on statin for now.

## 2019-12-24 NOTE — Progress Notes (Signed)
BP (!) 159/89 (BP Location: Left Arm, Patient Position: Sitting, Cuff Size: Normal)   Pulse 61   Temp 97.6 F (36.4 C) (Oral)   Ht 5' 5.75" (1.67 m)   Wt 244 lb (110.7 kg)   LMP 12/14/2019 (Within Days)   SpO2 100%   BMI 39.69 kg/m    Subjective:    Patient ID: Teresa Peters, female    DOB: 1971-02-28, 49 y.o.   MRN: [id number]  HPI: Teresa Peters is a 49 y.o. female  Chief Complaint  Patient presents with  . Diabetes   DIABETES Hypoglycemic episodes:no Polydipsia/polyuria: no Visual disturbance: no Chest pain: no Paresthesias: no Glucose Monitoring: no  Accucheck frequency: Not Checking Taking Insulin?: no Blood Pressure Monitoring: not checking Retinal Examination: Not up to Date Foot Exam: done today Diabetic Education: Not Completed Pneumovax: refused Influenza: refused Aspirin: no  Relevant past medical, surgical, family and social history reviewed and updated as indicated. Interim medical history since our last visit reviewed. Allergies and medications reviewed and updated.  Review of Systems  Constitutional: Negative.   HENT: Negative.   Respiratory: Negative.   Cardiovascular: Negative.   Gastrointestinal: Negative.   Musculoskeletal: Positive for myalgias. Negative for arthralgias, back pain, gait problem, joint swelling, neck pain and neck stiffness.  Psychiatric/Behavioral: Negative for agitation, behavioral problems, confusion, decreased concentration, dysphoric mood, hallucinations, self-injury, sleep disturbance and suicidal ideas. The patient is nervous/anxious. The patient is not hyperactive.     Per HPI unless specifically indicated above     Objective:    BP (!) 159/89 (BP Location: Left Arm, Patient Position: Sitting, Cuff Size: Normal)   Pulse 61   Temp 97.6 F (36.4 C) (Oral)   Ht 5' 5.75" (1.67 m)   Wt 244 lb (110.7 kg)   LMP 12/14/2019 (Within Days)   SpO2 100%   BMI 39.69 kg/m   Wt Readings from Last 3 Encounters:   12/24/19 244 lb (110.7 kg)  01/14/19 242 lb (109.8 kg)  01/06/19 245 lb (111.1 kg)    Physical Exam Vitals and nursing note reviewed.  Constitutional:      General: She is not in acute distress.    Appearance: Normal appearance. She is not ill-appearing, toxic-appearing or diaphoretic.  HENT:     Head: Normocephalic and atraumatic.     Right Ear: External ear normal.     Left Ear: External ear normal.     Nose: Nose normal.     Mouth/Throat:     Mouth: Mucous membranes are moist.     Pharynx: Oropharynx is clear.  Eyes:     General: No scleral icterus.       Right eye: No discharge.        Left eye: No discharge.     Extraocular Movements: Extraocular movements intact.     Conjunctiva/sclera: Conjunctivae normal.     Pupils: Pupils are equal, round, and reactive to light.  Cardiovascular:     Rate and Rhythm: Normal rate and regular rhythm.     Pulses: Normal pulses.     Heart sounds: Normal heart sounds. No murmur heard.  No friction rub. No gallop.   Pulmonary:     Effort: Pulmonary effort is normal. No respiratory distress.     Breath sounds: Normal breath sounds. No stridor. No wheezing, rhonchi or rales.  Chest:     Chest wall: No tenderness.  Musculoskeletal:        General: Normal range of motion.  Cervical back: Normal range of motion and neck supple.  Skin:    General: Skin is warm and dry.     Capillary Refill: Capillary refill takes less than 2 seconds.     Coloration: Skin is not jaundiced or pale.     Findings: No bruising, erythema, lesion or rash.  Neurological:     General: No focal deficit present.     Mental Status: She is alert and oriented to person, place, and time. Mental status is at baseline.  Psychiatric:        Mood and Affect: Mood normal.        Behavior: Behavior normal.        Thought Content: Thought content normal.        Judgment: Judgment normal.     Results for orders placed or performed in visit on 09/04/19  Microscopic  Examination   BLD  Result Value Ref Range   WBC, UA 0-5 0 - 5 /hpf   RBC None seen 0 - 2 /hpf   Epithelial Cells (non renal) 0-10 0 - 10 /hpf   Bacteria, UA None seen None seen/Few  Urine Culture, Reflex   BLD  Result Value Ref Range   Urine Culture, Routine Final report (A)    Organism ID, Bacteria Comment (A)   Bayer DCA Hb A1c Waived  Result Value Ref Range   HB A1C (BAYER DCA - WAIVED) 6.5 <7.0 %  CBC with Differential/Platelet  Result Value Ref Range   WBC 9.0 3.4 - 10.8 x10E3/uL   RBC 4.91 3.77 - 5.28 x10E6/uL   Hemoglobin 13.8 11.1 - 15.9 g/dL   Hematocrit 41.3 34.0 - 46.6 %   MCV 84 79 - 97 fL   MCH 28.1 26.6 - 33.0 pg   MCHC 33.4 31 - 35 g/dL   RDW 13.3 11.7 - 15.4 %   Platelets 274 150 - 450 x10E3/uL   Neutrophils 64 Not Estab. %   Lymphs 27 Not Estab. %   Monocytes 5 Not Estab. %   Eos 3 Not Estab. %   Basos 1 Not Estab. %   Neutrophils Absolute 5.8 1 - 7 x10E3/uL   Lymphocytes Absolute 2.5 0 - 3 x10E3/uL   Monocytes Absolute 0.5 0 - 0 x10E3/uL   EOS (ABSOLUTE) 0.3 0.0 - 0.4 x10E3/uL   Basophils Absolute 0.1 0 - 0 x10E3/uL   Immature Granulocytes 0 Not Estab. %   Immature Grans (Abs) 0.0 0.0 - 0.1 x10E3/uL  Comprehensive metabolic panel  Result Value Ref Range   Glucose 97 65 - 99 mg/dL   BUN 14 6 - 24 mg/dL   Creatinine, Ser 0.83 0.57 - 1.00 mg/dL   GFR calc non Af Amer 83 >59 mL/min/1.73   GFR calc Af Amer 96 >59 mL/min/1.73   BUN/Creatinine Ratio 17 9 - 23   Sodium 139 134 - 144 mmol/L   Potassium 4.4 3.5 - 5.2 mmol/L   Chloride 103 96 - 106 mmol/L   CO2 22 20 - 29 mmol/L   Calcium 9.5 8.7 - 10.2 mg/dL   Total Protein 7.1 6.0 - 8.5 g/dL   Albumin 4.4 3.8 - 4.8 g/dL   Globulin, Total 2.7 1.5 - 4.5 g/dL   Albumin/Globulin Ratio 1.6 1.2 - 2.2   Bilirubin Total 0.3 0.0 - 1.2 mg/dL   Alkaline Phosphatase 80 39 - 117 IU/L   AST 24 0 - 40 IU/L   ALT 20 0 - 32 IU/L  Lipid Panel w/o Chol/HDL  Ratio  Result Value Ref Range   Cholesterol, Total 206 (H)  100 - 199 mg/dL   Triglycerides 98 0 - 149 mg/dL   HDL 54 >24 mg/dL   VLDL Cholesterol Cal 18 5 - 40 mg/dL   LDL Chol Calc (NIH) 580 (H) 0 - 99 mg/dL  TSH  Result Value Ref Range   TSH 1.620 0.450 - 4.500 uIU/mL  UA/M w/rflx Culture, Routine   Specimen: Blood   BLD  Result Value Ref Range   Specific Gravity, UA 1.010 1.005 - 1.030   pH, UA 5.5 5.0 - 7.5   Color, UA Yellow Yellow   Appearance Ur Clear Clear   Leukocytes,UA Trace (A) Negative   Protein,UA Negative Negative/Trace   Glucose, UA Negative Negative   Ketones, UA 1+ (A) Negative   RBC, UA Negative Negative   Bilirubin, UA Negative Negative   Urobilinogen, Ur 0.2 0.2 - 1.0 mg/dL   Nitrite, UA Negative Negative   Microscopic Examination See below:    Urinalysis Reflex Comment   VITAMIN D 25 Hydroxy (Vit-D Deficiency, Fractures)  Result Value Ref Range   Vit D, 25-Hydroxy 80.2 30.0 - 100.0 ng/mL  B12  Result Value Ref Range   Vitamin B-12 594 232 - 1,245 pg/mL  C-reactive protein  Result Value Ref Range   CRP 5 0 - 10 mg/L      Assessment & Plan:   Problem List Items Addressed This Visit      Endocrine   Diet-controlled diabetes mellitus (HCC) - Primary    Doing well with A1c of 6.2 down from 6.5. Continue diet and exercise. Call with any concerns. Patient wants to hold on statin for now.       Relevant Medications   lisinopril (ZESTRIL) 5 MG tablet   Other Relevant Orders   Bayer DCA Hb A1c Waived   Microalbumin, Urine Waived   Ambulatory referral to Ophthalmology     Genitourinary   Benign hypertensive renal disease    Still running a little high, likely due to stress. Will start low dose lisinopril for renal protection and recheck 1 month. Call with any concerns.           Follow up plan: Return in about 4 weeks (around 01/21/2020) for follow up lisinopril.

## 2020-01-06 ENCOUNTER — Encounter: Payer: Self-pay | Admitting: Family Medicine

## 2020-01-08 ENCOUNTER — Encounter: Payer: Self-pay | Admitting: Family Medicine

## 2020-01-12 ENCOUNTER — Encounter: Payer: Self-pay | Admitting: Family Medicine

## 2020-01-12 ENCOUNTER — Ambulatory Visit (INDEPENDENT_AMBULATORY_CARE_PROVIDER_SITE_OTHER): Payer: BC Managed Care – PPO | Admitting: Family Medicine

## 2020-01-12 ENCOUNTER — Other Ambulatory Visit: Payer: Self-pay

## 2020-01-12 VITALS — BP 155/79 | HR 68 | Temp 98.6°F | Wt 247.6 lb

## 2020-01-12 DIAGNOSIS — R3989 Other symptoms and signs involving the genitourinary system: Secondary | ICD-10-CM

## 2020-01-12 DIAGNOSIS — I129 Hypertensive chronic kidney disease with stage 1 through stage 4 chronic kidney disease, or unspecified chronic kidney disease: Secondary | ICD-10-CM

## 2020-01-12 LAB — MICROSCOPIC EXAMINATION
Bacteria, UA: NONE SEEN
RBC, Urine: >30 /hpf — AB (ref 0–2)

## 2020-01-12 LAB — UA/M W/RFLX CULTURE, ROUTINE
Bilirubin, UA: NEGATIVE
Glucose, UA: NEGATIVE
Ketones, UA: NEGATIVE
Leukocytes,UA: NEGATIVE
Nitrite, UA: NEGATIVE
Specific Gravity, UA: <1.005 — ABNORMAL LOW (ref 1.005–1.030)
Urobilinogen, Ur: 0.2 mg/dL (ref 0.2–1.0)
pH, UA: 5.5 (ref 5.0–7.5)

## 2020-01-12 NOTE — Patient Instructions (Signed)
DASH Eating Plan DASH stands for "Dietary Approaches to Stop Hypertension." The DASH eating plan is a healthy eating plan that has been shown to reduce high blood pressure (hypertension). It may also reduce your risk for type 2 diabetes, heart disease, and stroke. The DASH eating plan may also help with weight loss. What are tips for following this plan?  General guidelines  Avoid eating more than 2,300 mg (milligrams) of salt (sodium) a day. If you have hypertension, you may need to reduce your sodium intake to 1,500 mg a day.  Limit alcohol intake to no more than 1 drink a day for nonpregnant women and 2 drinks a day for men. One drink equals 12 oz of beer, 5 oz of wine, or 1 oz of hard liquor.  Work with your health care provider to maintain a healthy body weight or to lose weight. Ask what an ideal weight is for you.  Get at least 30 minutes of exercise that causes your heart to beat faster (aerobic exercise) most days of the week. Activities may include walking, swimming, or biking.  Work with your health care provider or diet and nutrition specialist (dietitian) to adjust your eating plan to your individual calorie needs. Reading food labels   Check food labels for the amount of sodium per serving. Choose foods with less than 5 percent of the Daily Value of sodium. Generally, foods with less than 300 mg of sodium per serving fit into this eating plan.  To find whole grains, look for the word "whole" as the first word in the ingredient list. Shopping  Buy products labeled as "low-sodium" or "no salt added."  Buy fresh foods. Avoid canned foods and premade or frozen meals. Cooking  Avoid adding salt when cooking. Use salt-free seasonings or herbs instead of table salt or sea salt. Check with your health care provider or pharmacist before using salt substitutes.  Do not fry foods. Cook foods using healthy methods such as baking, boiling, grilling, and broiling instead.  Cook with  heart-healthy oils, such as olive, canola, soybean, or sunflower oil. Meal planning  Eat a balanced diet that includes: ? 5 or more servings of fruits and vegetables each day. At each meal, try to fill half of your plate with fruits and vegetables. ? Up to 6-8 servings of whole grains each day. ? Less than 6 oz of lean meat, poultry, or fish each day. A 3-oz serving of meat is about the same size as a deck of cards. One egg equals 1 oz. ? 2 servings of low-fat dairy each day. ? A serving of nuts, seeds, or beans 5 times each week. ? Heart-healthy fats. Healthy fats called Omega-3 fatty acids are found in foods such as flaxseeds and coldwater fish, like sardines, salmon, and mackerel.  Limit how much you eat of the following: ? Canned or prepackaged foods. ? Food that is high in trans fat, such as fried foods. ? Food that is high in saturated fat, such as fatty meat. ? Sweets, desserts, sugary drinks, and other foods with added sugar. ? Full-fat dairy products.  Do not salt foods before eating.  Try to eat at least 2 vegetarian meals each week.  Eat more home-cooked food and less restaurant, buffet, and fast food.  When eating at a restaurant, ask that your food be prepared with less salt or no salt, if possible. What foods are recommended? The items listed may not be a complete list. Talk with your dietitian about   what dietary choices are best for you. Grains Whole-grain or whole-wheat bread. Whole-grain or whole-wheat pasta. Brown rice. Oatmeal. Quinoa. Bulgur. Whole-grain and low-sodium cereals. Pita bread. Low-fat, low-sodium crackers. Whole-wheat flour tortillas. Vegetables Fresh or frozen vegetables (raw, steamed, roasted, or grilled). Low-sodium or reduced-sodium tomato and vegetable juice. Low-sodium or reduced-sodium tomato sauce and tomato paste. Low-sodium or reduced-sodium canned vegetables. Fruits All fresh, dried, or frozen fruit. Canned fruit in natural juice (without  added sugar). Meat and other protein foods Skinless chicken or turkey. Ground chicken or turkey. Pork with fat trimmed off. Fish and seafood. Egg whites. Dried beans, peas, or lentils. Unsalted nuts, nut butters, and seeds. Unsalted canned beans. Lean cuts of beef with fat trimmed off. Low-sodium, lean deli meat. Dairy Low-fat (1%) or fat-free (skim) milk. Fat-free, low-fat, or reduced-fat cheeses. Nonfat, low-sodium ricotta or cottage cheese. Low-fat or nonfat yogurt. Low-fat, low-sodium cheese. Fats and oils Soft margarine without trans fats. Vegetable oil. Low-fat, reduced-fat, or light mayonnaise and salad dressings (reduced-sodium). Canola, safflower, olive, soybean, and sunflower oils. Avocado. Seasoning and other foods Herbs. Spices. Seasoning mixes without salt. Unsalted popcorn and pretzels. Fat-free sweets. What foods are not recommended? The items listed may not be a complete list. Talk with your dietitian about what dietary choices are best for you. Grains Baked goods made with fat, such as croissants, muffins, or some breads. Dry pasta or rice meal packs. Vegetables Creamed or fried vegetables. Vegetables in a cheese sauce. Regular canned vegetables (not low-sodium or reduced-sodium). Regular canned tomato sauce and paste (not low-sodium or reduced-sodium). Regular tomato and vegetable juice (not low-sodium or reduced-sodium). Pickles. Olives. Fruits Canned fruit in a light or heavy syrup. Fried fruit. Fruit in cream or butter sauce. Meat and other protein foods Fatty cuts of meat. Ribs. Fried meat. Bacon. Sausage. Bologna and other processed lunch meats. Salami. Fatback. Hotdogs. Bratwurst. Salted nuts and seeds. Canned beans with added salt. Canned or smoked fish. Whole eggs or egg yolks. Chicken or turkey with skin. Dairy Whole or 2% milk, cream, and half-and-half. Whole or full-fat cream cheese. Whole-fat or sweetened yogurt. Full-fat cheese. Nondairy creamers. Whipped toppings.  Processed cheese and cheese spreads. Fats and oils Butter. Stick margarine. Lard. Shortening. Ghee. Bacon fat. Tropical oils, such as coconut, palm kernel, or palm oil. Seasoning and other foods Salted popcorn and pretzels. Onion salt, garlic salt, seasoned salt, table salt, and sea salt. Worcestershire sauce. Tartar sauce. Barbecue sauce. Teriyaki sauce. Soy sauce, including reduced-sodium. Steak sauce. Canned and packaged gravies. Fish sauce. Oyster sauce. Cocktail sauce. Horseradish that you find on the shelf. Ketchup. Mustard. Meat flavorings and tenderizers. Bouillon cubes. Hot sauce and Tabasco sauce. Premade or packaged marinades. Premade or packaged taco seasonings. Relishes. Regular salad dressings. Where to find more information:  National Heart, Lung, and Blood Institute: www.nhlbi.nih.gov  American Heart Association: www.heart.org Summary  The DASH eating plan is a healthy eating plan that has been shown to reduce high blood pressure (hypertension). It may also reduce your risk for type 2 diabetes, heart disease, and stroke.  With the DASH eating plan, you should limit salt (sodium) intake to 2,300 mg a day. If you have hypertension, you may need to reduce your sodium intake to 1,500 mg a day.  When on the DASH eating plan, aim to eat more fresh fruits and vegetables, whole grains, lean proteins, low-fat dairy, and heart-healthy fats.  Work with your health care provider or diet and nutrition specialist (dietitian) to adjust your eating plan to your   individual calorie needs. This information is not intended to replace advice given to you by your health care provider. Make sure you discuss any questions you have with your health care provider. Document Revised: 06/01/2017 Document Reviewed: 06/12/2016 Elsevier Patient Education  2020 Else[employer] Inc.  

## 2020-01-12 NOTE — Progress Notes (Signed)
BP (!) 155/79 (BP Location: Left Arm, Patient Position: Sitting, Cuff Size: Normal)   Pulse 68   Temp 98.6 F (37 C) (Oral)   Wt 247 lb 9.6 oz (112.3 kg)   LMP 01/12/2020 (Exact Date)   SpO2 97%   BMI 40.27 kg/m    Subjective:    Patient ID: Teresa Peters, female    DOB: 05-08-1971, 49 y.o.   MRN: [id number]  HPI: Teresa Peters is a 50 y.o. female  Chief Complaint  Patient presents with  . Hypertension  . Hip Pain  . Medication Problem    side effects   Having some pain in her hip after working out this AM.   HYPERTENSION- doesn't like the lisinopril. She has been reading about it on the internet. She notes that she has been having some urinary issues. She had some abdominal pain as well. She notes that she has not been feeling well on it.  Hypertension status: uncontrolled  Satisfied with current treatment? yes Duration of hypertension: months BP monitoring frequency:  not checking BP medication side effects:  unclear Medication compliance: good compliance Previous BP meds: lisinopril Aspirin: no Recurrent headaches: no Visual changes: no Palpitations: no Dyspnea: no Chest pain: no Lower extremity edema: no Dizzy/lightheaded: no   URINARY SYMPTOMS Duration: past few weeks, worse the last couple of days Dysuria: no Urinary frequency: yes Urgency: yes Small volume voids: yes Symptom severity: moderate Urinary incontinence: no Foul odor: no Hematuria: no Abdominal pain: yes Back pain: no Suprapubic pain/pressure: yes Flank pain: no Fever:  no Vomiting: no Relief with cranberry juice: no Relief with pyridium: no Status: stable Previous urinary tract infection: no Treatments attempted: increasing fluids    Relevant past medical, surgical, family and social history reviewed and updated as indicated. Interim medical history since our last visit reviewed. Allergies and medications reviewed and updated.  Review of Systems  Constitutional:  Negative.   Respiratory: Negative.   Cardiovascular: Negative.   Gastrointestinal: Negative.   Genitourinary: Positive for frequency and urgency. Negative for decreased urine volume, difficulty urinating, dyspareunia, dysuria, enuresis, flank pain, genital sores, hematuria, menstrual problem, pelvic pain, vaginal bleeding, vaginal discharge and vaginal pain.  Musculoskeletal: Negative.   Skin: Negative.   Psychiatric/Behavioral: Negative.     Per HPI unless specifically indicated above     Objective:    BP (!) 155/79 (BP Location: Left Arm, Patient Position: Sitting, Cuff Size: Normal)   Pulse 68   Temp 98.6 F (37 C) (Oral)   Wt 247 lb 9.6 oz (112.3 kg)   LMP 01/12/2020 (Exact Date)   SpO2 97%   BMI 40.27 kg/m   Wt Readings from Last 3 Encounters:  01/12/20 247 lb 9.6 oz (112.3 kg)  12/24/19 244 lb (110.7 kg)  01/14/19 242 lb (109.8 kg)    Physical Exam Vitals and nursing note reviewed.  Constitutional:      General: She is not in acute distress.    Appearance: Normal appearance. She is not ill-appearing, toxic-appearing or diaphoretic.  HENT:     Head: Normocephalic and atraumatic.     Right Ear: External ear normal.     Left Ear: External ear normal.     Nose: Nose normal.     Mouth/Throat:     Mouth: Mucous membranes are moist.     Pharynx: Oropharynx is clear.  Eyes:     General: No scleral icterus.       Right eye: No discharge.  Left eye: No discharge.     Extraocular Movements: Extraocular movements intact.     Conjunctiva/sclera: Conjunctivae normal.     Pupils: Pupils are equal, round, and reactive to light.  Cardiovascular:     Rate and Rhythm: Normal rate and regular rhythm.     Pulses: Normal pulses.     Heart sounds: Normal heart sounds. No murmur heard.  No friction rub. No gallop.   Pulmonary:     Effort: Pulmonary effort is normal. No respiratory distress.     Breath sounds: Normal breath sounds. No stridor. No wheezing, rhonchi or rales.   Chest:     Chest wall: No tenderness.  Musculoskeletal:        General: Normal range of motion.     Cervical back: Normal range of motion and neck supple.  Skin:    General: Skin is warm and dry.     Capillary Refill: Capillary refill takes less than 2 seconds.     Coloration: Skin is not jaundiced or pale.     Findings: No bruising, erythema, lesion or rash.  Neurological:     General: No focal deficit present.     Mental Status: She is alert and oriented to person, place, and time. Mental status is at baseline.  Psychiatric:        Mood and Affect: Mood normal.        Behavior: Behavior normal.        Thought Content: Thought content normal.        Judgment: Judgment normal.     Results for orders placed or performed in visit on 12/24/19  Bayer DCA Hb A1c Waived  Result Value Ref Range   HB A1C (BAYER DCA - WAIVED) 6.2 <7.0 %  Microalbumin, Urine Waived  Result Value Ref Range   Microalb, Ur Waived 10 0 - 19 mg/L   Creatinine, Urine Waived 50 10 - 300 mg/dL   Microalb/Creat Ratio 30-300 (H) <30 mg/g      Assessment & Plan:   Problem List Items Addressed This Visit      Genitourinary   Benign hypertensive renal disease - Primary    Not under good control. Does not want to take any medicine. Will try DASH diet for 3 months and work on diet and exercise. Recheck 3 months. Call with any concerns.       Relevant Orders   Basic metabolic panel    Other Visit Diagnoses    Bladder pain       Will check UA today and treat as needed. Call with any concerns.    Relevant Orders   UA/M w/rflx Culture, Routine       Follow up plan: Return in about 3 months (around 04/13/2020).

## 2020-01-12 NOTE — Assessment & Plan Note (Signed)
Not under good control. Does not want to take any medicine. Will try DASH diet for 3 months and work on diet and exercise. Recheck 3 months. Call with any concerns.

## 2020-01-13 LAB — BASIC METABOLIC PANEL
BUN/Creatinine Ratio: 19 (ref 9–23)
BUN: 15 mg/dL (ref 6–24)
CO2: 23 mmol/L (ref 20–29)
Calcium: 9.4 mg/dL (ref 8.7–10.2)
Chloride: 100 mmol/L (ref 96–106)
Creatinine, Ser: 0.79 mg/dL (ref 0.57–1.00)
GFR calc Af Amer: 101 mL/min/{1.73_m2} (ref >59)
GFR calc non Af Amer: 88 mL/min/{1.73_m2} (ref >59)
Glucose: 116 mg/dL — ABNORMAL HIGH (ref 65–99)
Potassium: 4 mmol/L (ref 3.5–5.2)
Sodium: 139 mmol/L (ref 134–144)

## 2020-02-04 ENCOUNTER — Ambulatory Visit: Payer: BC Managed Care – PPO | Admitting: Family Medicine

## 2020-02-19 ENCOUNTER — Ambulatory Visit: Payer: BC Managed Care – PPO | Admitting: Family Medicine

## 2020-03-01 ENCOUNTER — Encounter: Payer: Self-pay | Admitting: Family Medicine

## 2020-03-03 ENCOUNTER — Telehealth (INDEPENDENT_AMBULATORY_CARE_PROVIDER_SITE_OTHER): Payer: BC Managed Care – PPO | Admitting: Family Medicine

## 2020-03-03 ENCOUNTER — Encounter: Payer: Self-pay | Admitting: Family Medicine

## 2020-03-03 ENCOUNTER — Telehealth: Payer: Self-pay | Admitting: Nurse Practitioner

## 2020-03-03 VITALS — HR 66

## 2020-03-03 DIAGNOSIS — U071 COVID-19: Secondary | ICD-10-CM

## 2020-03-03 MED ORDER — HYDROCOD POLST-CPM POLST ER 10-8 MG/5ML PO SUER: 5.0000 mL | Freq: Two times a day (BID) | ORAL | 0 refills | Status: DC | PRN

## 2020-03-03 MED ORDER — ONDANSETRON 4 MG PO TBDP: 4.0000 mg | ORAL_TABLET | Freq: Three times a day (TID) | ORAL | 0 refills | Status: DC | PRN

## 2020-03-03 NOTE — Telephone Encounter (Signed)
  I connected by phone with Teresa Peters on 03/03/2020 at 12:24 PM to discuss the potential use of an new treatment for mild to moderate COVID-19 viral infection in non-hospitalized patients.  This patient is a 49 y.o. female that meets the FDA criteria for Emergency Use Authorization of bamlanivimab:  Has a (+) direct SARS-CoV-2 viral test result  Has mild or moderate COVID-19   Is ? 50 years of age and weighs ? 40 kg  Is NOT hospitalized due to COVID-19  Is NOT requiring oxygen therapy or requiring an increase in baseline oxygen flow rate due to COVID-19  Is within 10 days of symptom onset  Has at least one of the high risk factor(s) for progression to severe COVID-19 and/or hospitalization as defined in EUA.  Specific high risk criteria : BMI > 25 and Diabetes  Reviewed patient chronic problem list, which is currently managed by their PCP.  I have spoken and communicated the following to the patient or parent/caregiver:  1. FDA has authorized the emergency use of bamlanivimab for the treatment of mild to moderate COVID-19 in adults and pediatric patients with positive results of direct SARS-CoV-2 viral testing who are 46 years of age and older weighing at least 40 kg, and who are at high risk for progressing to severe COVID-19 and/or hospitalization.  2. The significant known and potential risks and benefits of bamlanivimab, and the extent to which such potential risks and benefits are unknown.  3. Information on available alternative treatments and the risks and benefits of those alternatives, including clinical trials.  4. Patients treated with bamlanivimab should continue to self-isolate and use infection control measures (e.g., wear mask, isolate, social distance, avoid sharing personal items, clean and disinfect "high touch" surfaces, and frequent handwashing) according to CDC guidelines.   5. The patient or parent/caregiver has the option to accept or refuse  bamlanivimab.  After reviewing this information with the patient, wishes to think about infusion and will alert PCP or this provider within 24 hours if wishes to proceed.  [name] 03/03/2020 12:24 PM

## 2020-03-03 NOTE — Progress Notes (Signed)
Pulse 66    SpO2 99%    Subjective:    Patient ID: Teresa Peters, female    DOB: 1971/02/26, 49 y.o.   MRN: [id number]  HPI: Teresa Peters is a 49 y.o. female  Chief Complaint  Patient presents with   Sore Throat   Nausea   Headache   Muscle Pain   sinus drainage   Cough    productive   UPPER RESPIRATORY TRACT INFECTION- did the home test on Sunday and another one this AM and it was positive Duration: 4 days Worst symptom: nausea, muscle pains Fever: yes Cough: yes Shortness of breath: no Wheezing: yes Chest pain: no Chest tightness: no Chest congestion: no Nasal congestion: yes Runny nose: yes Post nasal drip: yes Sneezing: no Sore throat: yes Swollen glands: no Sinus pressure: yes Headache: yes Face pain: yes Toothache: no Ear pain: no  Ear pressure: yes bilateral Eyes red/itching:no Eye drainage/crusting: no  Vomiting: yes Rash: no Fatigue: yes Sick contacts: unsure Strep contacts: no  Context: stable Recurrent sinusitis: no Relief with OTC cold/cough medications: no  Treatments attempted: cold/sinus, mucinex, anti-histamine and pseudoephedrine   Had a wasp sting last week and is not sure if that   Relevant past medical, surgical, family and social history reviewed and updated as indicated. Interim medical history since our last visit reviewed. Allergies and medications reviewed and updated.  Review of Systems  Constitutional: Positive for chills, diaphoresis, fatigue and fever. Negative for activity change, appetite change and unexpected weight change.  HENT: Positive for congestion, ear pain, postnasal drip, rhinorrhea, sinus pressure, sinus pain, sneezing and sore throat. Negative for dental problem, drooling, ear discharge, facial swelling, hearing loss, mouth sores, nosebleeds, tinnitus, trouble swallowing and voice change.   Respiratory: Positive for cough and wheezing. Negative for apnea, choking, chest tightness, shortness of  breath and stridor.   Cardiovascular: Negative.   Gastrointestinal: Positive for nausea and vomiting. Negative for abdominal distention, abdominal pain, anal bleeding, blood in stool, constipation, diarrhea and rectal pain.  Musculoskeletal: Negative.   Skin: Negative.   Neurological: Negative.   Psychiatric/Behavioral: Negative.     Per HPI unless specifically indicated above     Objective:    Pulse 66    SpO2 99%   Wt Readings from Last 3 Encounters:  01/12/20 247 lb 9.6 oz (112.3 kg)  12/24/19 244 lb (110.7 kg)  01/14/19 242 lb (109.8 kg)    Physical Exam Vitals and nursing note reviewed.  Constitutional:      General: She is not in acute distress.    Appearance: Normal appearance. She is well-developed. She is obese. She is not ill-appearing, toxic-appearing or diaphoretic.  HENT:     Head: Normocephalic and atraumatic.     Right Ear: External ear normal.     Left Ear: External ear normal.     Nose: Nose normal.     Mouth/Throat:     Mouth: Mucous membranes are moist.     Pharynx: Oropharynx is clear.  Eyes:     General: No scleral icterus.       Right eye: No discharge.        Left eye: No discharge.     Conjunctiva/sclera: Conjunctivae normal.     Pupils: Pupils are equal, round, and reactive to light.  Pulmonary:     Effort: Pulmonary effort is normal. No respiratory distress.     Comments: Speaking in full sentences Musculoskeletal:        General: Normal  range of motion.     Cervical back: Normal range of motion.  Skin:    Coloration: Skin is not jaundiced or pale.     Findings: No bruising, erythema, lesion or rash.  Neurological:     Mental Status: She is alert and oriented to person, place, and time. Mental status is at baseline.  Psychiatric:        Mood and Affect: Mood normal.        Behavior: Behavior normal.        Thought Content: Thought content normal.        Judgment: Judgment normal.     Results for orders placed or performed in visit on  01/12/20  Microscopic Examination   Urine  Result Value Ref Range   WBC, UA 0-5 0 - 5 /hpf   RBC >30 (A) 0 - 2 /hpf   Epithelial Cells (non renal) 0-10 0 - 10 /hpf   Bacteria, UA None seen None seen/Few  Basic metabolic panel  Result Value Ref Range   Glucose 116 (H) 65 - 99 mg/dL   BUN 15 6 - 24 mg/dL   Creatinine, Ser 1.85 0.57 - 1.00 mg/dL   GFR calc non Af Amer 88 >59 mL/min/1.73   GFR calc Af Amer 101 >59 mL/min/1.73   BUN/Creatinine Ratio 19 9 - 23   Sodium 139 134 - 144 mmol/L   Potassium 4.0 3.5 - 5.2 mmol/L   Chloride 100 96 - 106 mmol/L   CO2 23 20 - 29 mmol/L   Calcium 9.4 8.7 - 10.2 mg/dL  UA/M w/rflx Culture, Routine   Specimen: Urine   Urine  Result Value Ref Range   Specific Gravity, UA <1.005 (L) 1.005 - 1.030   pH, UA 5.5 5.0 - 7.5   Color, UA Red (A) Yellow   Appearance Ur Hazy (A) Clear   Leukocytes,UA Negative Negative   Protein,UA 1+ (A) Negative/Trace   Glucose, UA Negative Negative   Ketones, UA Negative Negative   RBC, UA 3+ (A) Negative   Bilirubin, UA Negative Negative   Urobilinogen, Ur 0.2 0.2 - 1.0 mg/dL   Nitrite, UA Negative Negative   Microscopic Examination See below:       Assessment & Plan:   Problem List Items Addressed This Visit    None    Visit Diagnoses    COVID-19    -  Primary   + at home. Self-quarantine until 03/10/20/no symptoms for 3 days. Zofran and tussionex for comfort. Call with any concerns. Continue to monitor.       Follow up plan: Return in about 1 week (around 03/10/2020).    This visit was completed via MyChart due to the restrictions of the COVID-19 pandemic. All issues as above were discussed and addressed. Physical exam was done as above through visual confirmation on MyChart. If it was felt that the patient should be evaluated in the office, they were directed there. The patient verbally consented to this visit.  Location of the patient: home  Location of the provider: work  Those involved with this  call:   Provider: Olevia Perches, DO  CMA: Floydene Flock, RMA  Front Desk/Registration: Adela Ports   Time spent on call: 15 minutes with patient face to face via video conference. More than 50% of this time was spent in counseling and coordination of care. 23 minutes total spent in review of patient's record and preparation of their chart.

## 2020-03-04 ENCOUNTER — Other Ambulatory Visit: Payer: Self-pay | Admitting: Nurse Practitioner

## 2020-03-04 ENCOUNTER — Telehealth: Payer: Self-pay | Admitting: Nurse Practitioner

## 2020-03-04 DIAGNOSIS — U071 COVID-19: Secondary | ICD-10-CM

## 2020-03-04 NOTE — Telephone Encounter (Signed)
I got a mychart message- she would like to proceed with the infusions.

## 2020-03-04 NOTE — Telephone Encounter (Signed)
  I connected by phone with Teresa Peters on 03/04/2020 at 12:17 PM to discuss the potential use of an new treatment for mild to moderate COVID-19 viral infection in non-hospitalized patients.  This patient is a 49 y.o. female that meets the FDA criteria for Emergency Use Authorization of bamlanivimab:  Has a (+) direct SARS-CoV-2 viral test result  Has mild or moderate COVID-19   Is ? 50 years of age and weighs ? 40 kg  Is NOT hospitalized due to COVID-19  Is NOT requiring oxygen therapy or requiring an increase in baseline oxygen flow rate due to COVID-19  Is within 10 days of symptom onset  Has at least one of the high risk factor(s) for progression to severe COVID-19 and/or hospitalization as defined in EUA.  Specific high risk criteria : BMI > 25 and Diabetes  Reviewed patient chronic problem list, which is currently managed by their PCP.  I have spoken and communicated the following to the patient or parent/caregiver:  1. FDA has authorized the emergency use of bamlanivimab for the treatment of mild to moderate COVID-19 in adults and pediatric patients with positive results of direct SARS-CoV-2 viral testing who are 21 years of age and older weighing at least 40 kg, and who are at high risk for progressing to severe COVID-19 and/or hospitalization.  2. The significant known and potential risks and benefits of bamlanivimab, and the extent to which such potential risks and benefits are unknown.  3. Information on available alternative treatments and the risks and benefits of those alternatives, including clinical trials.  4. Patients treated with bamlanivimab should continue to self-isolate and use infection control measures (e.g., wear mask, isolate, social distance, avoid sharing personal items, clean and disinfect "high touch" surfaces, and frequent handwashing) according to CDC guidelines.   5. The patient or parent/caregiver has the option to accept or refuse  bamlanivimab.  After reviewing this information with the patient, The patient agreed to proceed with receiving casirivimab\imdevimab infusion and will be provided a copy of the Fact sheet prior to receiving the infusion.  Marjie Skiff 03/04/2020 12:17 PM

## 2020-03-04 NOTE — Telephone Encounter (Signed)
She is scheduled for tomorrow.

## 2020-03-05 ENCOUNTER — Ambulatory Visit (HOSPITAL_COMMUNITY)
Admission: RE | Admit: 2020-03-05 | Discharge: 2020-03-05 | Disposition: A | Discharge status: Home / Self Care | Payer: BC Managed Care – PPO | Source: Ambulatory Visit | Attending: Cardiology | Admitting: Cardiology

## 2020-03-05 DIAGNOSIS — U071 COVID-19: Secondary | ICD-10-CM | POA: Insufficient documentation

## 2020-03-05 MED ORDER — DIPHENHYDRAMINE HCL 50 MG/ML IJ SOLN: 50.0000 mg | Freq: Once | INTRAMUSCULAR | Status: DC | PRN

## 2020-03-05 MED ORDER — FAMOTIDINE IN NACL 20-0.9 MG/50ML-% IV SOLN: 20.0000 mg | Freq: Once | INTRAVENOUS | Status: DC | PRN

## 2020-03-05 MED ORDER — SODIUM CHLORIDE 0.9 % IV SOLN: INTRAVENOUS | Status: DC | PRN

## 2020-03-05 MED ORDER — ALBUTEROL SULFATE HFA 108 (90 BASE) MCG/ACT IN AERS: 2.0000 | INHALATION_SPRAY | Freq: Once | RESPIRATORY_TRACT | Status: DC | PRN

## 2020-03-05 MED ORDER — SODIUM CHLORIDE 0.9 % IV SOLN
1200.0000 mg | Freq: Once | INTRAVENOUS | Status: AC
  Administered 2020-03-05: 1200 mg via INTRAVENOUS
  Filled 2020-03-05: qty 10

## 2020-03-05 MED ORDER — EPINEPHRINE 0.3 MG/0.3ML IJ SOAJ: 0.3000 mg | Freq: Once | INTRAMUSCULAR | Status: DC | PRN

## 2020-03-05 MED ORDER — METHYLPREDNISOLONE SODIUM SUCC 125 MG IJ SOLR: 125.0000 mg | Freq: Once | INTRAMUSCULAR | Status: DC | PRN

## 2020-03-05 NOTE — Progress Notes (Signed)
  Diagnosis: COVID-19  Physician: Dr. Patrick Wright  Procedure: [facility] Med: casirivimab\imdevimab infusion - Provided patient with casirivimab\imdevimab fact sheet for patients, parents and caregivers prior to infusion.  Complications: No immediate complications noted.  Discharge: Discharged home   Teresa Peters 03/05/2020   

## 2020-03-05 NOTE — Discharge Instructions (Signed)

## 2020-03-08 ENCOUNTER — Emergency Department
Admission: EM | Admit: 2020-03-08 | Discharge: 2020-03-08 | Disposition: A | Discharge status: Home / Self Care | Payer: BC Managed Care – PPO | Attending: Emergency Medicine | Admitting: Emergency Medicine

## 2020-03-08 ENCOUNTER — Other Ambulatory Visit: Payer: Self-pay

## 2020-03-08 ENCOUNTER — Encounter: Payer: Self-pay | Admitting: Emergency Medicine

## 2020-03-08 ENCOUNTER — Emergency Department: Payer: BC Managed Care – PPO

## 2020-03-08 DIAGNOSIS — U071 COVID-19: Secondary | ICD-10-CM | POA: Insufficient documentation

## 2020-03-08 DIAGNOSIS — R079 Chest pain, unspecified: Secondary | ICD-10-CM | POA: Diagnosis not present

## 2020-03-08 DIAGNOSIS — Z5321 Procedure and treatment not carried out due to patient leaving prior to being seen by health care provider: Secondary | ICD-10-CM | POA: Insufficient documentation

## 2020-03-08 LAB — BASIC METABOLIC PANEL
Anion gap: 9 (ref 5–15)
BUN: 9 mg/dL (ref 6–20)
CO2: 24 mmol/L (ref 22–32)
Calcium: 8.9 mg/dL (ref 8.9–10.3)
Chloride: 104 mmol/L (ref 98–111)
Creatinine, Ser: 0.57 mg/dL (ref 0.44–1.00)
GFR calc Af Amer: >60 mL/min (ref >60)
GFR calc non Af Amer: >60 mL/min (ref >60)
Glucose, Bld: 115 mg/dL — ABNORMAL HIGH (ref 70–99)
Potassium: 3.8 mmol/L (ref 3.5–5.1)
Sodium: 137 mmol/L (ref 135–145)

## 2020-03-08 LAB — CBC
HCT: 40.5 % (ref 36.0–46.0)
Hemoglobin: 13.3 g/dL (ref 12.0–15.0)
MCH: 27.4 pg (ref 26.0–34.0)
MCHC: 32.8 g/dL (ref 30.0–36.0)
MCV: 83.5 fL (ref 80.0–100.0)
Platelets: 179 10*3/uL (ref 150–400)
RBC: 4.85 MIL/uL (ref 3.87–5.11)
RDW: 14.3 % (ref 11.5–15.5)
WBC: 7 10*3/uL (ref 4.0–10.5)
nRBC: 0 % (ref 0.0–0.2)

## 2020-03-08 LAB — TROPONIN I (HIGH SENSITIVITY): Troponin I (High Sensitivity): 7 ng/L (ref <18)

## 2020-03-08 NOTE — ED Triage Notes (Signed)
C/O chest pain and pressure since this morning.  BP was elevated at home.  Continues to feel pain to mid chest.  AAOx3.  Skin warm and dry.  NAD.  No SOB/ DOE.   Covid + day 9.

## 2020-03-09 ENCOUNTER — Telehealth (INDEPENDENT_AMBULATORY_CARE_PROVIDER_SITE_OTHER): Payer: BC Managed Care – PPO | Admitting: Family Medicine

## 2020-03-09 ENCOUNTER — Encounter: Payer: Self-pay | Admitting: Family Medicine

## 2020-03-09 DIAGNOSIS — U071 COVID-19: Secondary | ICD-10-CM | POA: Diagnosis not present

## 2020-03-09 NOTE — Progress Notes (Signed)
LMP  (LMP Unknown)    Subjective:    Patient ID: Teresa Peters, female    DOB: May 22, 1971, 49 y.o.   MRN: [id number]  HPI: Teresa Peters is a 49 y.o. female  Chief Complaint  Patient presents with  . Covid Positive    1wk covid   Has been fluctuating from day to day. She notes that some days she's feeling better, but other days she feels worse. Yesterday, she didn't feel well. She notes that she was having some chest pain yesterday. She had some constipation associated with that. She took her blood pressure yesterday and her BP was about 153/100. She went to the ER yesterday but left without being seen. She feels like the stress of yesterday made her feel worse today.   She notes that since the infusions she is feeling much better. She has been able to sleep at night without pain. She has some minimal phlegem. No fevers. No chills. She is generally feeling a lot better.   Relevant past medical, surgical, family and social history reviewed and updated as indicated. Interim medical history since our last visit reviewed. Allergies and medications reviewed and updated.  Review of Systems  Constitutional: Positive for chills, diaphoresis and fatigue. Negative for activity change, appetite change, fever and unexpected weight change.  HENT: Negative.   Respiratory: Negative.   Cardiovascular: Positive for chest pain. Negative for palpitations and leg swelling.  Gastrointestinal: Positive for constipation. Negative for abdominal distention, abdominal pain, anal bleeding, blood in stool, diarrhea, nausea, rectal pain and vomiting.  Musculoskeletal: Negative.   Skin: Negative.   Neurological: Negative.   Psychiatric/Behavioral: Negative.     Per HPI unless specifically indicated above     Objective:    LMP  (LMP Unknown)   Wt Readings from Last 3 Encounters:  03/08/20 247 lb 9.2 oz (112.3 kg)  01/12/20 247 lb 9.6 oz (112.3 kg)  12/24/19 244 lb (110.7 kg)    Physical  Exam Vitals and nursing note reviewed.  Constitutional:      General: She is not in acute distress.    Appearance: Normal appearance. She is not ill-appearing, toxic-appearing or diaphoretic.  HENT:     Head: Normocephalic and atraumatic.     Right Ear: External ear normal.     Left Ear: External ear normal.     Nose: Nose normal.     Mouth/Throat:     Mouth: Mucous membranes are moist.     Pharynx: Oropharynx is clear.  Eyes:     General: No scleral icterus.       Right eye: No discharge.        Left eye: No discharge.     Conjunctiva/sclera: Conjunctivae normal.     Pupils: Pupils are equal, round, and reactive to light.  Pulmonary:     Effort: Pulmonary effort is normal. No respiratory distress.     Comments: Speaking in full sentences Musculoskeletal:        General: Normal range of motion.     Cervical back: Normal range of motion.  Skin:    Coloration: Skin is not jaundiced or pale.     Findings: No bruising, erythema, lesion or rash.  Neurological:     Mental Status: She is alert and oriented to person, place, and time. Mental status is at baseline.  Psychiatric:        Mood and Affect: Mood normal.        Behavior: Behavior normal.  Thought Content: Thought content normal.        Judgment: Judgment normal.     Results for orders placed or performed during the hospital encounter of 03/08/20  Basic metabolic panel  Result Value Ref Range   Sodium 137 135 - 145 mmol/L   Potassium 3.8 3.5 - 5.1 mmol/L   Chloride 104 98 - 111 mmol/L   CO2 24 22 - 32 mmol/L   Glucose, Bld 115 (H) 70 - 99 mg/dL   BUN 9 6 - 20 mg/dL   Creatinine, Ser 1.49 0.44 - 1.00 mg/dL   Calcium 8.9 8.9 - 70.2 mg/dL   GFR calc non Af Amer >60 >60 mL/min   GFR calc Af Amer >60 >60 mL/min   Anion gap 9 5 - 15  CBC  Result Value Ref Range   WBC 7.0 4.0 - 10.5 K/uL   RBC 4.85 3.87 - 5.11 MIL/uL   Hemoglobin 13.3 12.0 - 15.0 g/dL   HCT 63.7 36 - 46 %   MCV 83.5 80.0 - 100.0 fL   MCH  27.4 26.0 - 34.0 pg   MCHC 32.8 30.0 - 36.0 g/dL   RDW 85.8 85.0 - 27.7 %   Platelets 179 150 - 400 K/uL   nRBC 0.0 0.0 - 0.2 %  Troponin I (High Sensitivity)  Result Value Ref Range   Troponin I (High Sensitivity) 7 <18 ng/L      Assessment & Plan:   Problem List Items Addressed This Visit    None    Visit Diagnoses    COVID-19    -  Primary   Out of quarantine tomorrow. Feeling better. Continue to monitor. Call with any concerns.        Follow up plan: Return if symptoms worsen or fail to improve.   . This visit was completed via MyChart due to the restrictions of the COVID-19 pandemic. All issues as above were discussed and addressed. Physical exam was done as above through visual confirmation on MyChart. If it was felt that the patient should be evaluated in the office, they were directed there. The patient verbally consented to this visit. . Location of the patient: home . Location of the provider: work . Those involved with this call:  . Provider: Olevia Perches, DO . CMA: Floydene Flock, RMA . Front Desk/Registration: Adela Ports  . Time spent on call: 15  minutes with patient face to face via video conference. More than 50% of this time was spent in counseling and coordination of care. 23 minutes total spent in review of patient's record and preparation of their chart.

## 2020-04-13 ENCOUNTER — Encounter: Payer: Self-pay | Admitting: Family Medicine

## 2020-04-13 ENCOUNTER — Other Ambulatory Visit: Payer: Self-pay

## 2020-04-13 ENCOUNTER — Ambulatory Visit (INDEPENDENT_AMBULATORY_CARE_PROVIDER_SITE_OTHER): Payer: BC Managed Care – PPO | Admitting: Family Medicine

## 2020-04-13 VITALS — BP 122/66 | HR 68 | Temp 98.4°F | Ht 65.75 in | Wt 239.0 lb

## 2020-04-13 DIAGNOSIS — U099 Post covid-19 condition, unspecified: Secondary | ICD-10-CM | POA: Diagnosis not present

## 2020-04-13 DIAGNOSIS — D649 Anemia, unspecified: Secondary | ICD-10-CM

## 2020-04-13 DIAGNOSIS — E119 Type 2 diabetes mellitus without complications: Secondary | ICD-10-CM

## 2020-04-13 DIAGNOSIS — E039 Hypothyroidism, unspecified: Secondary | ICD-10-CM

## 2020-04-13 NOTE — Patient Instructions (Signed)
Varicose Veins Varicose veins are veins that have become enlarged, bulged, and twisted. They most often appear in the legs. What are the causes? This condition is caused by damage to the valves in the vein. These valves help blood return to your heart. When they are damaged and they stop working properly, blood may flow backward and back up in the veins near the skin, causing the veins to get larger and appear twisted. The condition can result from any issue that causes blood to back up, like pregnancy, prolonged standing, or obesity. What increases the risk? This condition is more likely to develop in people who are:  On their feet a lot.  Pregnant.  Overweight. What are the signs or symptoms? Symptoms of this condition include:  Bulging, twisted, and bluish veins.  A feeling of heaviness. This may be worse at the end of the day.  Leg pain. This may be worse at the end of the day.  Swelling in the leg.  Changes in skin color over the veins. How is this diagnosed? This condition may be diagnosed based on your symptoms, a physical exam, and an ultrasound test. How is this treated? Treatment for this condition may involve:  Avoiding sitting or standing in one position for long periods of time.  Wearing compression stockings. These stockings help to prevent blood clots and reduce swelling in the legs.  Raising (elevating) the legs when resting.  Losing weight.  Exercising regularly. If you have persistent symptoms or want to improve the way your varicose veins look, you may choose to have a procedure to close the varicose veins off or to remove them. Treatments to close off the veins include:  Sclerotherapy. In this treatment, a solution is injected into a vein to close it off.  Laser treatment. In this treatment, the vein is heated with a laser to close it off.  Radiofrequency vein ablation. In this treatment, an electrical current produced by radio waves is used to close  off the vein. Treatments to remove the veins include:  Phlebectomy. In this treatment, the veins are removed through small incisions made over the veins.  Vein ligation and stripping. In this treatment, incisions are made over the veins. The veins are then removed after being tied (ligated) with stitches (sutures). Follow these instructions at home: Activity  Walk as much as possible. Walking increases blood flow. This helps blood return to the heart and takes pressure off your veins. It also increases your cardiovascular strength.  Follow your health care provider's instructions about exercising.  Do not stand or sit in one position for a long period of time.  Do not sit with your legs crossed.  Rest with your legs raised during the day. General instructions   Follow any diet instructions given to you by your health care provider.  Wear compression stockings as directed by your health care provider. Do not wear other kinds of tight clothing around your legs, pelvis, or waist.  Elevate your legs at night to above the level of your heart.  If you get a cut in the skin over the varicose vein and the vein bleeds: ? Lie down with your leg raised. ? Apply firm pressure to the cut with a clean cloth until the bleeding stops. ? Place a bandage (dressing) on the cut. Contact a health care provider if:  The skin around your varicose veins starts to break down.  You have pain, redness, tenderness, or hard swelling over a vein.  You   are uncomfortable because of pain.  You get a cut in the skin over a varicose vein and it will not stop bleeding. Summary  Varicose veins are veins that have become enlarged, bulged, and twisted. They most often appear in the legs.  This condition is caused by damage to the valves in the vein. These valves help blood return to your heart.  Treatment for this condition includes frequent movements, wearing compression stockings, losing weight, and  exercising regularly. In some cases, procedures are done to close off or remove the veins.  Treatment for this condition may include wearing compression stockings, elevating the legs, losing weight, and engaging in regular activity. In some cases, procedures are done to close off or remove the veins. This information is not intended to replace advice given to you by your health care provider. Make sure you discuss any questions you have with your health care provider. Document Revised: 08/15/2018 Document Reviewed: 07/12/2016 [employer] Patient Education  2020 [employer]  

## 2020-04-13 NOTE — Progress Notes (Signed)
BP 122/66   Pulse 68   Temp 98.4 F (36.9 C) (Oral)   Ht 5' 5.75" (1.67 m)   Wt 239 lb (108.4 kg)   SpO2 99%   BMI 38.87 kg/m    Subjective:    Patient ID: Teresa Peters, female    DOB: 10-28-1970, 49 y.o.   MRN: [id number]  HPI: Teresa Peters is a 49 y.o. female  Chief Complaint  Patient presents with  . Hypertensive Renal Disease    Follow up.   Recovered mostly from her COVID. She notes that she was concerned about circulation issues and was feeling "clotty." This has gotten better. She has been very tired since having COVID. This has been improving slowly. She was having some heart palpitations with activity. She is not able to fully explain how she is feeling, but knows that she was not feeling right. She has been cutting her thyroid medicine in 1/2 for the past few days. She has not been taking her iron pill.   DIABETES Hypoglycemic episodes:no Polydipsia/polyuria: no Visual disturbance: no Chest pain: no Paresthesias: no Glucose Monitoring: no  Accucheck frequency: Not Checking Taking Insulin?: no Blood Pressure Monitoring: not checking Retinal Examination: Not up to Date Foot Exam: Up to Date Diabetic Education: Not Completed Pneumovax: declined Influenza: declined Aspirin: no    Results for orders placed or performed in visit on 04/13/20  Comprehensive metabolic panel  Result Value Ref Range   Glucose 94 65 - 99 mg/dL   BUN 10 6 - 24 mg/dL   Creatinine, Ser 1.02 0.57 - 1.00 mg/dL   GFR calc non Af Amer 92 >59 mL/min/1.73   GFR calc Af Amer 106 >59 mL/min/1.73   BUN/Creatinine Ratio 13 9 - 23   Sodium 136 134 - 144 mmol/L   Potassium 4.1 3.5 - 5.2 mmol/L   Chloride 101 96 - 106 mmol/L   CO2 21 20 - 29 mmol/L   Calcium 9.3 8.7 - 10.2 mg/dL   Total Protein 7.1 6.0 - 8.5 g/dL   Albumin 4.4 3.8 - 4.8 g/dL   Globulin, Total 2.7 1.5 - 4.5 g/dL   Albumin/Globulin Ratio 1.6 1.2 - 2.2   Bilirubin Total 0.5 0.0 - 1.2 mg/dL   Alkaline Phosphatase  78 44 - 121 IU/L   AST 17 0 - 40 IU/L   ALT 17 0 - 32 IU/L  Bayer DCA Hb A1c Waived  Result Value Ref Range   HB A1C (BAYER DCA - WAIVED) 6.1 <7.0 %  Lipid Panel w/o Chol/HDL Ratio  Result Value Ref Range   Cholesterol, Total 200 (H) 100 - 199 mg/dL   Triglycerides 585 0 - 149 mg/dL   HDL 52 >27 mg/dL   VLDL Cholesterol Cal 22 5 - 40 mg/dL   LDL Chol Calc (NIH) 782 (H) 0 - 99 mg/dL  TSH  Result Value Ref Range   TSH 2.170 0.450 - 4.500 uIU/mL  Ferritin  Result Value Ref Range   Ferritin 23 15.0 - 150.0 ng/mL   Relevant past medical, surgical, family and social history reviewed and updated as indicated. Interim medical history since our last visit reviewed. Allergies and medications reviewed and updated.  Review of Systems  Constitutional: Negative.   Respiratory: Negative.   Cardiovascular: Negative.   Gastrointestinal: Negative.   Musculoskeletal: Negative.   Neurological: Negative.   Psychiatric/Behavioral: Negative.     Per HPI unless specifically indicated above     Objective:    BP 122/66  Pulse 68   Temp 98.4 F (36.9 C) (Oral)   Ht 5' 5.75" (1.67 m)   Wt 239 lb (108.4 kg)   SpO2 99%   BMI 38.87 kg/m   Wt Readings from Last 3 Encounters:  04/13/20 239 lb (108.4 kg)  03/08/20 247 lb 9.2 oz (112.3 kg)  01/12/20 247 lb 9.6 oz (112.3 kg)    Physical Exam Vitals and nursing note reviewed.  Constitutional:      General: She is not in acute distress.    Appearance: Normal appearance. She is not ill-appearing, toxic-appearing or diaphoretic.  HENT:     Head: Normocephalic and atraumatic.     Right Ear: External ear normal.     Left Ear: External ear normal.     Nose: Nose normal.     Mouth/Throat:     Mouth: Mucous membranes are moist.     Pharynx: Oropharynx is clear.  Eyes:     General: No scleral icterus.       Right eye: No discharge.        Left eye: No discharge.     Extraocular Movements: Extraocular movements intact.     Conjunctiva/sclera:  Conjunctivae normal.     Pupils: Pupils are equal, round, and reactive to light.  Cardiovascular:     Rate and Rhythm: Normal rate and regular rhythm.     Pulses: Normal pulses.     Heart sounds: Normal heart sounds. No murmur heard.  No friction rub. No gallop.   Pulmonary:     Effort: Pulmonary effort is normal. No respiratory distress.     Breath sounds: Normal breath sounds. No stridor. No wheezing, rhonchi or rales.  Chest:     Chest wall: No tenderness.  Musculoskeletal:        General: Normal range of motion.     Cervical back: Normal range of motion and neck supple.  Skin:    General: Skin is warm and dry.     Capillary Refill: Capillary refill takes less than 2 seconds.     Coloration: Skin is not jaundiced or pale.     Findings: No bruising, erythema, lesion or rash.  Neurological:     General: No focal deficit present.     Mental Status: She is alert and oriented to person, place, and time. Mental status is at baseline.  Psychiatric:        Mood and Affect: Mood normal.        Behavior: Behavior normal.        Thought Content: Thought content normal.        Judgment: Judgment normal.     Results for orders placed or performed in visit on 04/13/20  Comprehensive metabolic panel  Result Value Ref Range   Glucose 94 65 - 99 mg/dL   BUN 10 6 - 24 mg/dL   Creatinine, Ser 8.76 0.57 - 1.00 mg/dL   GFR calc non Af Amer 92 >59 mL/min/1.73   GFR calc Af Amer 106 >59 mL/min/1.73   BUN/Creatinine Ratio 13 9 - 23   Sodium 136 134 - 144 mmol/L   Potassium 4.1 3.5 - 5.2 mmol/L   Chloride 101 96 - 106 mmol/L   CO2 21 20 - 29 mmol/L   Calcium 9.3 8.7 - 10.2 mg/dL   Total Protein 7.1 6.0 - 8.5 g/dL   Albumin 4.4 3.8 - 4.8 g/dL   Globulin, Total 2.7 1.5 - 4.5 g/dL   Albumin/Globulin Ratio 1.6 1.2 - 2.2  Bilirubin Total 0.5 0.0 - 1.2 mg/dL   Alkaline Phosphatase 78 44 - 121 IU/L   AST 17 0 - 40 IU/L   ALT 17 0 - 32 IU/L  Bayer DCA Hb A1c Waived  Result Value Ref Range    HB A1C (BAYER DCA - WAIVED) 6.1 <7.0 %  Lipid Panel w/o Chol/HDL Ratio  Result Value Ref Range   Cholesterol, Total 200 (H) 100 - 199 mg/dL   Triglycerides 884 0 - 149 mg/dL   HDL 52 >16 mg/dL   VLDL Cholesterol Cal 22 5 - 40 mg/dL   LDL Chol Calc (NIH) 606 (H) 0 - 99 mg/dL  TSH  Result Value Ref Range   TSH 2.170 0.450 - 4.500 uIU/mL  Ferritin  Result Value Ref Range   Ferritin 23 15.0 - 150.0 ng/mL      Assessment & Plan:   Problem List Items Addressed This Visit      Endocrine   Acquired hypothyroidism    Rechecking labs today. Await results. Treat as needed.       Relevant Orders   TSH   Diet-controlled diabetes mellitus (HCC) - Primary    Doing well with A1c of 6.1. Continue diet and exercise. Call with any concerns. Continue to monitor.       Relevant Orders   Comprehensive metabolic panel (Completed)   Bayer DCA Hb A1c Waived (Completed)   Lipid Panel w/o Chol/HDL Ratio (Completed)     Other   Mild anemia    Checking labs today. Await results. Treat as needed.       Relevant Orders   Ferritin    Other Visit Diagnoses    Post-COVID syndrome       Continue to monitor. Call with any concerns. If not improved by Thanksgiving, will get her into post-covid clinic.        Follow up plan: Return in about 6 months (around 10/12/2020).

## 2020-04-14 ENCOUNTER — Encounter: Payer: Self-pay | Admitting: Family Medicine

## 2020-04-14 LAB — COMPREHENSIVE METABOLIC PANEL
ALT: 17 IU/L (ref 0–32)
AST: 17 IU/L (ref 0–40)
Albumin/Globulin Ratio: 1.6 (ref 1.2–2.2)
Albumin: 4.4 g/dL (ref 3.8–4.8)
Alkaline Phosphatase: 78 IU/L (ref 44–121)
BUN/Creatinine Ratio: 13 (ref 9–23)
BUN: 10 mg/dL (ref 6–24)
Bilirubin Total: 0.5 mg/dL (ref 0.0–1.2)
CO2: 21 mmol/L (ref 20–29)
Calcium: 9.3 mg/dL (ref 8.7–10.2)
Chloride: 101 mmol/L (ref 96–106)
Creatinine, Ser: 0.76 mg/dL (ref 0.57–1.00)
GFR calc Af Amer: 106 mL/min/{1.73_m2} (ref >59)
GFR calc non Af Amer: 92 mL/min/{1.73_m2} (ref >59)
Globulin, Total: 2.7 g/dL (ref 1.5–4.5)
Glucose: 94 mg/dL (ref 65–99)
Potassium: 4.1 mmol/L (ref 3.5–5.2)
Sodium: 136 mmol/L (ref 134–144)
Total Protein: 7.1 g/dL (ref 6.0–8.5)

## 2020-04-14 LAB — BAYER DCA HB A1C WAIVED: HB A1C (BAYER DCA - WAIVED): 6.1 % (ref <7.0)

## 2020-04-14 LAB — LIPID PANEL W/O CHOL/HDL RATIO
Cholesterol, Total: 200 mg/dL — ABNORMAL HIGH (ref 100–199)
HDL: 52 mg/dL (ref >39)
LDL Chol Calc (NIH): 126 mg/dL — ABNORMAL HIGH (ref 0–99)
Triglycerides: 121 mg/dL (ref 0–149)
VLDL Cholesterol Cal: 22 mg/dL (ref 5–40)

## 2020-04-14 LAB — TSH: TSH: 2.17 u[IU]/mL (ref 0.450–4.500)

## 2020-04-14 LAB — FERRITIN: Ferritin: 23 ng/mL (ref 15–150)

## 2020-04-14 NOTE — Assessment & Plan Note (Signed)
Rechecking labs today. Await results. Treat as needed.  °

## 2020-04-14 NOTE — Assessment & Plan Note (Signed)
Doing well with A1c of 6.1. Continue diet and exercise. Call with any concerns. Continue to monitor.  

## 2020-04-14 NOTE — Assessment & Plan Note (Signed)
Checking labs today. Await results. Treat as needed.  

## 2020-05-12 IMAGING — US US BREAST*L* LIMITED INC AXILLA
1 series · 2 of 2 positions shown · non-contrast
Comparison: Previous exam(s).

CLINICAL DATA: Left lateral breast skin lesion, treated by a
dermatologist.

EXAM:
DIGITAL DIAGNOSTIC BILATERAL MAMMOGRAM WITH CAD AND TOMO
ULTRASOUND LEFT BREAST

[Series 1: us breast*left* limited inc axilla · 0.09mm/px · 2 of 2 slices shown]
[im 1/2]
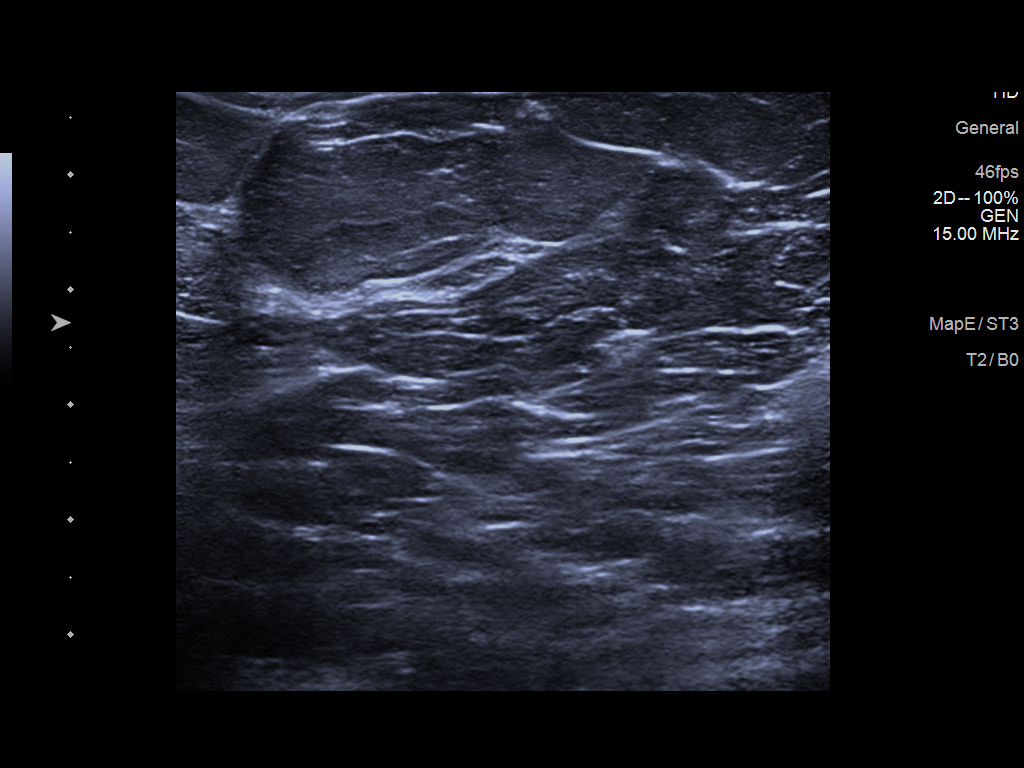
[im 2/2]
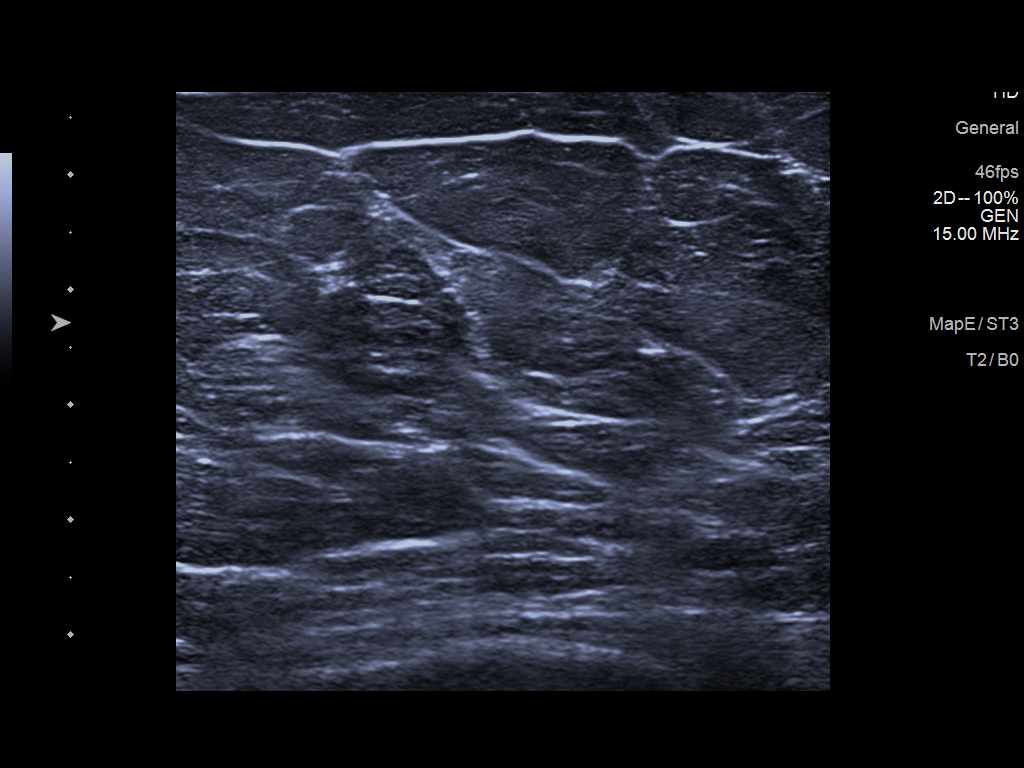

[2 of 2 positions shown; findings below may reference images not displayed]

ACR Breast Density Category b: There are scattered areas of
fibroglandular density.
FINDINGS: Mammographically, there are no suspicious masses, areas of
architectural distortion or microcalcifications in either breast.

Mammographic images were processed with CAD.

On physical exam, small pink papule in the left 3 o'clock breast.

Targeted ultrasound of the left lateral breast is performed, showing
no suspicious masses or shadowing lesions in the area of interest in
the lateral left breast.
IMPRESSION: No mammographic or sonographic evidence of malignancy in either
breast.

Benign-appearing skin papule, for which continued dermatology
follow-up is recommended.

RECOMMENDATION:
Screening mammogram in one year.(Code:SB-C-Z96)

I have discussed the findings and recommendations with the patient.
Results were also provided in writing at the conclusion of the
visit. If applicable, a reminder letter will be sent to the patient
regarding the next appointment.

BI-RADS CATEGORY  1: Negative.

## 2020-07-13 DIAGNOSIS — H40003 Preglaucoma, unspecified, bilateral: Secondary | ICD-10-CM | POA: Diagnosis not present

## 2020-07-15 ENCOUNTER — Other Ambulatory Visit: Payer: Self-pay | Admitting: Family Medicine

## 2020-10-08 ENCOUNTER — Encounter: Payer: Self-pay | Admitting: Family Medicine

## 2020-10-12 ENCOUNTER — Encounter: Payer: Self-pay | Admitting: Family Medicine

## 2020-10-12 ENCOUNTER — Other Ambulatory Visit: Payer: Self-pay

## 2020-10-12 ENCOUNTER — Ambulatory Visit (INDEPENDENT_AMBULATORY_CARE_PROVIDER_SITE_OTHER): Payer: BC Managed Care – PPO | Admitting: Family Medicine

## 2020-10-12 VITALS — BP 136/86 | HR 70 | Temp 98.2°F | Ht 66.75 in | Wt 243.0 lb

## 2020-10-12 DIAGNOSIS — E039 Hypothyroidism, unspecified: Secondary | ICD-10-CM | POA: Diagnosis not present

## 2020-10-12 DIAGNOSIS — Z0000 Encounter for general adult medical examination without abnormal findings: Secondary | ICD-10-CM | POA: Diagnosis not present

## 2020-10-12 DIAGNOSIS — I129 Hypertensive chronic kidney disease with stage 1 through stage 4 chronic kidney disease, or unspecified chronic kidney disease: Secondary | ICD-10-CM

## 2020-10-12 DIAGNOSIS — Z1231 Encounter for screening mammogram for malignant neoplasm of breast: Secondary | ICD-10-CM

## 2020-10-12 DIAGNOSIS — E119 Type 2 diabetes mellitus without complications: Secondary | ICD-10-CM | POA: Diagnosis not present

## 2020-10-12 DIAGNOSIS — E559 Vitamin D deficiency, unspecified: Secondary | ICD-10-CM

## 2020-10-12 DIAGNOSIS — R431 Parosmia: Secondary | ICD-10-CM

## 2020-10-12 DIAGNOSIS — E538 Deficiency of other specified B group vitamins: Secondary | ICD-10-CM | POA: Diagnosis not present

## 2020-10-12 DIAGNOSIS — D649 Anemia, unspecified: Secondary | ICD-10-CM

## 2020-10-12 LAB — URINALYSIS, ROUTINE W REFLEX MICROSCOPIC
Bilirubin, UA: NEGATIVE
Glucose, UA: NEGATIVE
Ketones, UA: NEGATIVE
Nitrite, UA: NEGATIVE
Protein,UA: NEGATIVE
Specific Gravity, UA: 1.015 (ref 1.005–1.030)
Urobilinogen, Ur: 0.2 mg/dL (ref 0.2–1.0)
pH, UA: 5.5 (ref 5.0–7.5)

## 2020-10-12 LAB — MICROSCOPIC EXAMINATION
Bacteria, UA: NONE SEEN
RBC, Urine: NONE SEEN /hpf (ref 0–2)
WBC, UA: NONE SEEN /hpf (ref 0–5)

## 2020-10-12 LAB — MICROALBUMIN, URINE WAIVED
Creatinine, Urine Waived: 200 mg/dL (ref 10–300)
Microalb, Ur Waived: 10 mg/L (ref 0–19)
Microalb/Creat Ratio: <30 mg/g (ref <30)

## 2020-10-12 LAB — BAYER DCA HB A1C WAIVED: HB A1C (BAYER DCA - WAIVED): 6.7 % (ref <7.0)

## 2020-10-12 NOTE — Assessment & Plan Note (Signed)
Checking labs today. Await results. Treat as needed.  

## 2020-10-12 NOTE — Assessment & Plan Note (Signed)
Discussed nose-retraining. Will get her into ENT. Call with any concerns. Continue to monitor.

## 2020-10-12 NOTE — Progress Notes (Signed)
BP 136/86   Pulse 70   Temp 98.2 F (36.8 C)   Ht 5' 6.75" (1.695 m)   Wt 243 lb (110.2 kg)   SpO2 99%   BMI 38.34 kg/m    Subjective:    Patient ID: Teresa Peters, female    DOB: Sep 29, 1970, 49 y.o.   MRN: [id number]  HPI: Teresa Peters is a 50 y.o. female presenting on 10/12/2020 for comprehensive medical examination. Current medical complaints include:  Has had loss of taste and smell since covid with strange smells as well. She has some smells, but not all of them- doing well with the herbs. Has been off her diet since then because things don't taste good.   HYPERTENSION Hypertension status: stable  Satisfied with current treatment? yes Duration of hypertension: chronic BP monitoring frequency:  occasionally Previous BP meds: none Aspirin: no Recurrent headaches: no Visual changes: no Palpitations: no Dyspnea: no Chest pain: no Lower extremity edema: no Dizzy/lightheaded: no  DIABETES Hypoglycemic episodes:no Polydipsia/polyuria: no Visual disturbance: no Chest pain: no Paresthesias: no Glucose Monitoring: no  Accucheck frequency: Not Checking Taking Insulin?: no Blood Pressure Monitoring: occasionally Retinal Examination: Not up to Date Foot Exam: Up to Date Diabetic Education: Completed Pneumovax: Declined Influenza: Declined Aspirin: no  HYPOTHYROIDISM Thyroid control status:stable Satisfied with current treatment? yes Medication side effects: no Medication compliance: excellent compliance Recent dose adjustment:no Fatigue: yes Cold intolerance: no Heat intolerance: no Weight gain: no Weight loss: no Constipation: no Diarrhea/loose stools: no Palpitations: no Lower extremity edema: no Anxiety/depressed mood: yes  She currently lives with: husband and kids Menopausal Symptoms: no  Depression Screen done today and results listed below:  Depression screen [facility] 2/9 10/12/2020 04/13/2020 09/05/2019 01/06/2019  Decreased Interest 0 1 3 1    Down, Depressed, Hopeless 0 1 3 1   PHQ - 2 Score 0 2 6 2   Altered sleeping - 0 3 0  Tired, decreased energy - 0 1 1  Change in appetite - 0 2 1  Feeling bad or failure about yourself  - 0 3 1  Trouble concentrating - 0 1 0  Moving slowly or fidgety/restless - 0 2 0  Suicidal thoughts - 0 1 0  PHQ-9 Score - 2 19 5   Difficult doing work/chores - Not difficult at all Somewhat difficult Not difficult at all    Past Medical History:  Past Medical History:  Diagnosis Date  . Anemia   . Cellulitis   . Diabetes mellitus without complication ([place])   . Fatty liver   . Hypothyroidism   . Obesity   . Prediabetes     Surgical History:  Past Surgical History:  Procedure Laterality Date  . ADENOIDECTOMY    . ADENOIDECTOMY    . COLONOSCOPY WITH PROPOFOL N/A 01/14/2019   Procedure: COLONOSCOPY WITH PROPOFOL;  Surgeon: Virgel Manifold, MD;  Location: [place];  Service: Endoscopy;  Laterality: N/A;  . ESOPHAGOGASTRODUODENOSCOPY  2012   polyps (pt states they were not removed)  . ESOPHAGOGASTRODUODENOSCOPY (EGD) WITH PROPOFOL N/A 01/14/2019   Procedure: ESOPHAGOGASTRODUODENOSCOPY (EGD) WITH BIOPSY;  Surgeon: Virgel Manifold, MD;  Location: [place];  Service: Endoscopy;  Laterality: N/A;  . FOOT SURGERY    . POLYPECTOMY N/A 01/14/2019   Procedure: POLYPECTOMY;  Surgeon: Virgel Manifold, MD;  Location: [place];  Service: Endoscopy;  Laterality: N/A;  Stomach    Medications:  Current Outpatient Medications on File Prior to Visit  Medication Sig  . B Complex  Vitamins (VITAMIN B COMPLEX PO) Take by mouth daily.  Marland Kitchen Cod Liver Oil 10 MINIM CAPS Take by mouth.  . Menaquinone-7 (VITAMIN K2 PO) Take by mouth daily.  Marland Kitchen VITAMIN D PO Take by mouth daily.   No current facility-administered medications on file prior to visit.    Allergies:  Allergies  Allergen Reactions  . Metformin And Related     Per previous provider's notes    Social  History:  Social History   Socioeconomic History  . Marital status: Married    Spouse name: Not on file  . Number of children: Not on file  . Years of education: Not on file  . Highest education level: Not on file  Occupational History  . Not on file  Tobacco Use  . Smoking status: Never Smoker  . Smokeless tobacco: Never Used  Vaping Use  . Vaping Use: Never used  Substance and Sexual Activity  . Alcohol use: Yes    Comment: rare - 1x/month  . Drug use: No  . Sexual activity: Yes    Birth control/protection: None  Other Topics Concern  . Not on file  Social History Narrative  . Not on file   Social Determinants of Health   Financial Resource Strain: Not on file  Food Insecurity: Not on file  Transportation Needs: Not on file  Physical Activity: Not on file  Stress: Not on file  Social Connections: Not on file  Intimate Partner Violence: Not on file   Social History   Tobacco Use  Smoking Status Never Smoker  Smokeless Tobacco Never Used   Social History   Substance and Sexual Activity  Alcohol Use Yes   Comment: rare - 1x/month    Family History:  Family History  Problem Relation Age of Onset  . Glaucoma Mother   . Cataracts Mother   . Diabetes Mellitus II Paternal Grandfather   . Diabetes Paternal Grandfather   . Hypertension Father   . Fibromyalgia Sister   . Obesity Sister   . Thyroid disease Son   . Raynaud syndrome Paternal Grandmother     Past medical history, surgical history, medications, allergies, family history and social history reviewed with patient today and changes made to appropriate areas of the chart.   Review of Systems  Constitutional: Negative.   HENT: Negative.   Eyes: Negative.   Respiratory: Negative.   Cardiovascular: Negative.   Gastrointestinal: Positive for nausea. Negative for abdominal pain, blood in stool, constipation, diarrhea, heartburn, melena and vomiting.  Genitourinary: Negative.   Musculoskeletal:  Positive for back pain and neck pain. Negative for falls, joint pain and myalgias.  Skin: Negative.   Neurological: Negative.   Endo/Heme/Allergies: Negative.   Psychiatric/Behavioral: Positive for depression. Negative for hallucinations, memory loss, substance abuse and suicidal ideas. The patient is not nervous/anxious and does not have insomnia.    All other ROS negative except what is listed above and in the HPI.      Objective:    BP 136/86   Pulse 70   Temp 98.2 F (36.8 C)   Ht 5' 6.75" (1.695 m)   Wt 243 lb (110.2 kg)   SpO2 99%   BMI 38.34 kg/m   Wt Readings from Last 3 Encounters:  10/12/20 243 lb (110.2 kg)  04/13/20 239 lb (108.4 kg)  03/08/20 247 lb 9.2 oz (112.3 kg)    Physical Exam Vitals and nursing note reviewed.  Constitutional:      General: She is not in acute  distress.    Appearance: Normal appearance. She is not ill-appearing, toxic-appearing or diaphoretic.  HENT:     Head: Normocephalic and atraumatic.     Right Ear: Tympanic membrane, ear canal and external ear normal. There is no impacted cerumen.     Left Ear: Tympanic membrane, ear canal and external ear normal. There is no impacted cerumen.     Nose: Nose normal. No congestion or rhinorrhea.     Mouth/Throat:     Mouth: Mucous membranes are moist.     Pharynx: Oropharynx is clear. No oropharyngeal exudate or posterior oropharyngeal erythema.  Eyes:     General: No scleral icterus.       Right eye: No discharge.        Left eye: No discharge.     Extraocular Movements: Extraocular movements intact.     Conjunctiva/sclera: Conjunctivae normal.     Pupils: Pupils are equal, round, and reactive to light.  Neck:     Vascular: No carotid bruit.  Cardiovascular:     Rate and Rhythm: Normal rate and regular rhythm.     Pulses: Normal pulses.     Heart sounds: No murmur heard. No friction rub. No gallop.   Pulmonary:     Effort: Pulmonary effort is normal. No respiratory distress.     Breath  sounds: Normal breath sounds. No stridor. No wheezing, rhonchi or rales.  Chest:     Chest wall: No tenderness.  Abdominal:     General: Abdomen is flat. Bowel sounds are normal. There is no distension.     Palpations: Abdomen is soft. There is no mass.     Tenderness: There is no abdominal tenderness. There is no right CVA tenderness, left CVA tenderness, guarding or rebound.     Hernia: No hernia is present.  Genitourinary:    Comments: Breast and pelvic exams deferred with shared decision making Musculoskeletal:        General: No swelling, tenderness, deformity or signs of injury.     Cervical back: Normal range of motion and neck supple. No rigidity. No muscular tenderness.     Right lower leg: No edema.     Left lower leg: No edema.  Lymphadenopathy:     Cervical: No cervical adenopathy.  Skin:    General: Skin is warm and dry.     Capillary Refill: Capillary refill takes less than 2 seconds.     Coloration: Skin is not jaundiced or pale.     Findings: No bruising, erythema, lesion or rash.  Neurological:     General: No focal deficit present.     Mental Status: She is alert and oriented to person, place, and time. Mental status is at baseline.     Cranial Nerves: No cranial nerve deficit.     Sensory: No sensory deficit.     Motor: No weakness.     Coordination: Coordination normal.     Gait: Gait normal.     Deep Tendon Reflexes: Reflexes normal.  Psychiatric:        Mood and Affect: Mood normal.        Behavior: Behavior normal.        Thought Content: Thought content normal.        Judgment: Judgment normal.     Results for orders placed or performed in visit on 10/12/20  Microscopic Examination   BLD  Result Value Ref Range   WBC, UA None seen 0 - 5 /hpf   RBC None seen  0 - 2 /hpf   Epithelial Cells (non renal) 0-10 0 - 10 /hpf   Bacteria, UA None seen None seen/Few  Bayer DCA Hb A1c Waived  Result Value Ref Range   HB A1C (BAYER DCA - WAIVED) 6.7 <7.0 %   Microalbumin, Urine Waived  Result Value Ref Range   Microalb, Ur Waived 10 0 - 19 mg/L   Creatinine, Urine Waived 200 10 - 300 mg/dL   Microalb/Creat Ratio <30 <30 mg/g  CBC with Differential/Platelet  Result Value Ref Range   WBC 9.2 3.4 - 10.8 x10E3/uL   RBC 4.83 3.77 - 5.28 x10E6/uL   Hemoglobin 13.6 11.1 - 15.9 g/dL   Hematocrit 41.1 34.0 - 46.6 %   MCV 85 79 - 97 fL   MCH 28.2 26.6 - 33.0 pg   MCHC 33.1 31.5 - 35.7 g/dL   RDW 13.5 11.7 - 15.4 %   Platelets 271 150 - 450 x10E3/uL   Neutrophils 61 Not Estab. %   Lymphs 29 Not Estab. %   Monocytes 5 Not Estab. %   Eos 4 Not Estab. %   Basos 1 Not Estab. %   Neutrophils Absolute 5.6 1.4 - 7.0 x10E3/uL   Lymphocytes Absolute 2.6 0.7 - 3.1 x10E3/uL   Monocytes Absolute 0.5 0.1 - 0.9 x10E3/uL   EOS (ABSOLUTE) 0.4 0.0 - 0.4 x10E3/uL   Basophils Absolute 0.1 0.0 - 0.2 x10E3/uL   Immature Granulocytes 0 Not Estab. %   Immature Grans (Abs) 0.0 0.0 - 0.1 x10E3/uL  Comprehensive metabolic panel  Result Value Ref Range   Glucose 96 65 - 99 mg/dL   BUN 14 6 - 24 mg/dL   Creatinine, Ser 0.88 0.57 - 1.00 mg/dL   eGFR 80 >59 mL/min/1.73   BUN/Creatinine Ratio 16 9 - 23   Sodium 140 134 - 144 mmol/L   Potassium 4.0 3.5 - 5.2 mmol/L   Chloride 101 96 - 106 mmol/L   CO2 21 20 - 29 mmol/L   Calcium 9.5 8.7 - 10.2 mg/dL   Total Protein 6.9 6.0 - 8.5 g/dL   Albumin 4.1 3.8 - 4.8 g/dL   Globulin, Total 2.8 1.5 - 4.5 g/dL   Albumin/Globulin Ratio 1.5 1.2 - 2.2   Bilirubin Total 0.3 0.0 - 1.2 mg/dL   Alkaline Phosphatase 88 44 - 121 IU/L   AST 20 0 - 40 IU/L   ALT 21 0 - 32 IU/L  Lipid Panel w/o Chol/HDL Ratio  Result Value Ref Range   Cholesterol, Total 201 (H) 100 - 199 mg/dL   Triglycerides 126 0 - 149 mg/dL   HDL 53 >39 mg/dL   VLDL Cholesterol Cal 22 5 - 40 mg/dL   LDL Chol Calc (NIH) 126 (H) 0 - 99 mg/dL  Urinalysis, Routine w reflex microscopic  Result Value Ref Range   Specific Gravity, UA 1.015 1.005 - 1.030   pH, UA  5.5 5.0 - 7.5   Color, UA Yellow Yellow   Appearance Ur Clear Clear   Leukocytes,UA Trace (A) Negative   Protein,UA Negative Negative/Trace   Glucose, UA Negative Negative   Ketones, UA Negative Negative   RBC, UA Trace (A) Negative   Bilirubin, UA Negative Negative   Urobilinogen, Ur 0.2 0.2 - 1.0 mg/dL   Nitrite, UA Negative Negative   Microscopic Examination See below:   TSH  Result Value Ref Range   TSH 1.460 0.450 - 4.500 uIU/mL  B12  Result Value Ref Range   Vitamin B-12 555  232 - 1,245 pg/mL  VITAMIN D 25 Hydroxy (Vit-D Deficiency, Fractures)  Result Value Ref Range   Vit D, 25-Hydroxy 65.0 30.0 - 100.0 ng/mL  Hepatitis C Antibody  Result Value Ref Range   Hep C Virus Ab <0.1 0.0 - 0.9 s/co ratio      Assessment & Plan:   Problem List Items Addressed This Visit      Endocrine   Acquired hypothyroidism    Checking labs today. Await results. Treat as needed.       Relevant Orders   CBC with Differential/Platelet (Completed)   Comprehensive metabolic panel (Completed)   TSH (Completed)   Diet-controlled diabetes mellitus ([place])    Up slightly with A1c of 6.7 from 6.1- really work on diet and exercise. Recheck 3-6 months. Call with any concerns.       Relevant Orders   Bayer DCA Hb A1c Waived (Completed)   Microalbumin, Urine Waived (Completed)   CBC with Differential/Platelet (Completed)   Comprehensive metabolic panel (Completed)   Lipid Panel w/o Chol/HDL Ratio (Completed)   Urinalysis, Routine w reflex microscopic (Completed)     Nervous and Auditory   Paraosmia    Discussed nose-retraining. Will get her into ENT. Call with any concerns. Continue to monitor.       Relevant Orders   Ambulatory referral to ENT     Genitourinary   Benign hypertensive renal disease    Better on recheck. Continue diet and exercise. Call if BP starts running high at home. Continue to monitor.       Relevant Orders   Microalbumin, Urine Waived (Completed)   CBC with  Differential/Platelet (Completed)   Comprehensive metabolic panel (Completed)   Urinalysis, Routine w reflex microscopic (Completed)     Other   Mild anemia    Rechecking labs today. Await results. Treat as needed.       Relevant Orders   CBC with Differential/Platelet (Completed)   Comprehensive metabolic panel (Completed)   VITAMIN D 25 Hydroxy (Vit-D Deficiency, Fractures) (Completed)   Vitamin B 12 deficiency    Will check labs. Await results. Treat as needed.       Relevant Orders   CBC with Differential/Platelet (Completed)   Comprehensive metabolic panel (Completed)   B12 (Completed)   Vitamin D deficiency    Will check labs. Await results. Treat as needed.       Relevant Orders   CBC with Differential/Platelet (Completed)   Comprehensive metabolic panel (Completed)    Other Visit Diagnoses    Routine general medical examination at a health care facility    -  Primary   Vaccines declined. Screening labs checked today. Pap to be done next visit. Mammogram ordered. Colon cancer screaning up to date. Continue diet and exercise.    Relevant Orders   Hepatitis C Antibody (Completed)   Encounter for screening mammogram for malignant neoplasm of breast       Mammogram ordered today.    Relevant Orders   MM DIAG BREAST TOMO BILATERAL       Follow up plan: Return in about 6 months (around 04/13/2021) for follow up with pap.   LABORATORY TESTING:  - Pap smear: Will come back for it  IMMUNIZATIONS:   - Tdap: Tetanus vaccination status reviewed: Refused. - Influenza: Postponed to flu season - Pneumovax: Refused - COVID : Refused  SCREENING: -Mammogram: Ordered today  - Colonoscopy: Up to date   PATIENT COUNSELING:   Advised to take 1 mg of folate supplement  per day if capable of pregnancy.   Sexuality: Discussed sexually transmitted diseases, partner selection, use of condoms, avoidance of unintended pregnancy  and contraceptive alternatives.   Advised to  avoid cigarette smoking.  I discussed with the patient that most people either abstain from alcohol or drink within safe limits (<=14/week and <=4 drinks/occasion for males, <=7/weeks and <= 3 drinks/occasion for females) and that the risk for alcohol disorders and other health effects rises proportionally with the number of drinks per week and how often a drinker exceeds daily limits.  Discussed cessation/primary prevention of drug use and availability of treatment for abuse.   Diet: Encouraged to adjust caloric intake to maintain  or achieve ideal body weight, to reduce intake of dietary saturated fat and total fat, to limit sodium intake by avoiding high sodium foods and not adding table salt, and to maintain adequate dietary potassium and calcium preferably from fresh fruits, vegetables, and low-fat dairy products.    stressed the importance of regular exercise  Injury prevention: Discussed safety belts, safety helmets, smoke detector, smoking near bedding or upholstery.   Dental health: Discussed importance of regular tooth brushing, flossing, and dental visits.    NEXT PREVENTATIVE PHYSICAL DUE IN 1 YEAR. Return in about 6 months (around 04/13/2021) for follow up with pap.

## 2020-10-12 NOTE — Assessment & Plan Note (Signed)
Will check labs. Await results. Treat as needed.  

## 2020-10-12 NOTE — Patient Instructions (Addendum)
Call to schedule your mammogram: [facility]  Address: [address], [place], [place] [address]  Phone: [phone]   Health Maintenance, Female Adopting a healthy lifestyle and getting preventive care are important in promoting health and wellness. Ask your health care provider about:  The right schedule for you to have regular tests and exams.  Things you can do on your own to prevent diseases and keep yourself healthy. What should I know about diet, weight, and exercise? Eat a healthy diet  Eat a diet that includes plenty of vegetables, fruits, low-fat dairy products, and lean protein.  Do not eat a lot of foods that are high in solid fats, added sugars, or sodium.   Maintain a healthy weight Body mass index (BMI) is used to identify weight problems. It estimates body fat based on height and weight. Your health care provider can help determine your BMI and help you achieve or maintain a healthy weight. Get regular exercise Get regular exercise. This is one of the most important things you can do for your health. Most adults should:  Exercise for at least 150 minutes each week. The exercise should increase your heart rate and make you sweat (moderate-intensity exercise).  Do strengthening exercises at least twice a week. This is in addition to the moderate-intensity exercise.  Spend less time sitting. Even light physical activity can be beneficial. Watch cholesterol and blood lipids Have your blood tested for lipids and cholesterol at 51 years of age, then have this test every 5 years. Have your cholesterol levels checked more often if:  Your lipid or cholesterol levels are high.  You are older than 51 years of age.  You are at high risk for heart disease. What should I know about cancer screening? Depending on your health history and family history, you may need to have cancer screening at various ages. This may include screening  for:  Breast cancer.  Cervical cancer.  Colorectal cancer.  Skin cancer.  Lung cancer. What should I know about heart disease, diabetes, and high blood pressure? Blood pressure and heart disease  High blood pressure causes heart disease and increases the risk of stroke. This is more likely to develop in people who have high blood pressure readings, are of African descent, or are overweight.  Have your blood pressure checked: ? Every 3-5 years if you are 18-39 years of age. ? Every year if you are 40 years old or older. Diabetes Have regular diabetes screenings. This checks your fasting blood sugar level. Have the screening done:  Once every three years after age 40 if you are at a normal weight and have a low risk for diabetes.  More often and at a younger age if you are overweight or have a high risk for diabetes. What should I know about preventing infection? Hepatitis B If you have a higher risk for hepatitis B, you should be screened for this virus. Talk with your health care provider to find out if you are at risk for hepatitis B infection. Hepatitis C Testing is recommended for:  Everyone born from 1945 through 1965.  Anyone with known risk factors for hepatitis C. Sexually transmitted infections (STIs)  Get screened for STIs, including gonorrhea and chlamydia, if: ? You are sexually active and are younger than 51 years of age. ? You are older than 51 years of age and your health care provider tells you that you are at risk for this type of   infection. ? Your sexual activity has changed since you were last screened, and you are at increased risk for chlamydia or gonorrhea. Ask your health care provider if you are at risk.  Ask your health care provider about whether you are at high risk for HIV. Your health care provider may recommend a prescription medicine to help prevent HIV infection. If you choose to take medicine to prevent HIV, you should first get tested for HIV.  You should then be tested every 3 months for as long as you are taking the medicine. Pregnancy  If you are about to stop having your period (premenopausal) and you may become pregnant, seek counseling before you get pregnant.  Take 400 to 800 micrograms (mcg) of folic acid every day if you become pregnant.  Ask for birth control (contraception) if you want to prevent pregnancy. Osteoporosis and menopause Osteoporosis is a disease in which the bones lose minerals and strength with aging. This can result in bone fractures. If you are 61 years old or older, or if you are at risk for osteoporosis and fractures, ask your health care provider if you should:  Be screened for bone loss.  Take a calcium or vitamin D supplement to lower your risk of fractures.  Be given hormone replacement therapy (HRT) to treat symptoms of menopause. Follow these instructions at home: Lifestyle  Do not use any products that contain nicotine or tobacco, such as cigarettes, e-cigarettes, and chewing tobacco. If you need help quitting, ask your health care provider.  Do not use street drugs.  Do not share needles.  Ask your health care provider for help if you need support or information about quitting drugs. Alcohol use  Do not drink alcohol if: ? Your health care provider tells you not to drink. ? You are pregnant, may be pregnant, or are planning to become pregnant.  If you drink alcohol: ? Limit how much you use to 0-1 drink a day. ? Limit intake if you are breastfeeding.  Be aware of how much alcohol is in your drink. In the [place] one drink equals one 12 oz bottle of beer (355 mL), one 5 oz glass of wine (148 mL), or one 1 oz glass of hard liquor (44 mL). General instructions  Schedule regular health, dental, and eye exams.  Stay current with your vaccines.  Tell your health care provider if: ? You often feel depressed. ? You have ever been abused or do not feel safe at  home. Summary  Adopting a healthy lifestyle and getting preventive care are important in promoting health and wellness.  Follow your health care provider's instructions about healthy diet, exercising, and getting tested or screened for diseases.  Follow your health care provider's instructions on monitoring your cholesterol and blood pressure. This information is not intended to replace advice given to you by your health care provider. Make sure you discuss any questions you have with your health care provider. Document Revised: 06/12/2018 Document Reviewed: 06/12/2018 Elsevier Patient Education  2021 Elsevier Inc.  Plantar Fasciitis Rehab Ask your health care provider which exercises are safe for you. Do exercises exactly as told by your health care provider and adjust them as directed. It is normal to feel mild stretching, pulling, tightness, or discomfort as you do these exercises. Stop right away if you feel sudden pain or your pain gets worse. Do not begin these exercises until told by your health care provider. Stretching and range-of-motion exercises These exercises warm up your muscles  and joints and improve the movement and flexibility of your foot. These exercises also help to relieve pain. Plantar fascia stretch 1. Sit with your left / right leg crossed over your opposite knee. 2. Hold your heel with one hand with that thumb near your arch. With your other hand, hold your toes and gently pull them back toward the top of your foot. You should feel a stretch on the base (bottom) of your toes, or the bottom of your foot (plantar fascia), or both. 3. Hold this stretch for__________ seconds. 4. Slowly release your toes and return to the starting position. Repeat __________ times. Complete this exercise __________ times a day.   Gastrocnemius stretch, standing This exercise is also called a calf (gastroc) stretch. It stretches the muscles in the back of the upper calf. 1. Stand with  your hands against a wall. 2. Extend your left / right leg behind you, and bend your front knee slightly. 3. Keeping your heels on the floor, your toes facing forward, and your back knee straight, shift your weight toward the wall. Do not arch your back. You should feel a gentle stretch in your upper calf. 4. Hold this position for __________ seconds. Repeat __________ times. Complete this exercise __________ times a day.   Soleus stretch, standing This exercise is also called a calf (soleus) stretch. It stretches the muscles in the back of the lower calf. 1. Stand with your hands against a wall. 2. Extend your left / right leg behind you, and bend your front knee slightly. 3. Keeping your heels on the floor and your toes facing forward, bend your back knee and shift your weight slightly over your back leg. You should feel a gentle stretch deep in your lower calf. 4. Hold this position for __________ seconds. Repeat __________ times. Complete this exercise __________ times a day. Gastroc and soleus stretch, standing step This exercise stretches the muscles in the back of the lower leg. These muscles are in the upper calf (gastrocnemius) and the lower calf (soleus). 1. Stand with the ball of your left / right foot on the front of a step. The ball of your foot is on the walking surface, right under your toes. 2. Keep your other foot firmly on the same step. 3. Hold on to the wall or a railing for balance. 4. Slowly lift your other foot, allowing your body weight to press your heel down over the edge of the front of the step. Keep knee straight and unbent. You should feel a stretch in your calf. 5. Hold this position for __________ seconds. 6. Return both feet to the step. 7. Repeat this exercise with a slight bend in your left / right knee. Repeat __________ times with your left / right knee straight and __________ times with your left / right knee bent. Complete this exercise __________ times a  day. Balance exercise This exercise builds your balance and strength control of your arch to help take pressure off your plantar fascia. Single leg stand If this exercise is too easy, you can try it with your eyes closed or while standing on a pillow. 1. Without shoes, stand near a railing or in a doorway. You may hold on to the railing or door frame as needed. 2. Stand on your left / right foot. Keep your big toe down on the floor and lift the arch of your foot. You should feel a stretch across the bottom of your foot and your arch. Do not  let your foot roll inward. 3. Hold this position for __________ seconds. Repeat __________ times. Complete this exercise __________ times a day. This information is not intended to replace advice given to you by your health care provider. Make sure you discuss any questions you have with your health care provider. Document Revised: 04/01/2020 Document Reviewed: 04/01/2020 Elsevier Patient Education  2021 [employer].

## 2020-10-13 ENCOUNTER — Other Ambulatory Visit: Payer: Self-pay | Admitting: Family Medicine

## 2020-10-13 LAB — CBC WITH DIFFERENTIAL/PLATELET
Basophils Absolute: 0.1 10*3/uL (ref 0.0–0.2)
Basos: 1 %
EOS (ABSOLUTE): 0.4 10*3/uL (ref 0.0–0.4)
Eos: 4 %
Hematocrit: 41.1 % (ref 34.0–46.6)
Hemoglobin: 13.6 g/dL (ref 11.1–15.9)
Immature Grans (Abs): 0 10*3/uL (ref 0.0–0.1)
Immature Granulocytes: 0 %
Lymphocytes Absolute: 2.6 10*3/uL (ref 0.7–3.1)
Lymphs: 29 %
MCH: 28.2 pg (ref 26.6–33.0)
MCHC: 33.1 g/dL (ref 31.5–35.7)
MCV: 85 fL (ref 79–97)
Monocytes Absolute: 0.5 10*3/uL (ref 0.1–0.9)
Monocytes: 5 %
Neutrophils Absolute: 5.6 10*3/uL (ref 1.4–7.0)
Neutrophils: 61 %
Platelets: 271 10*3/uL (ref 150–450)
RBC: 4.83 x10E6/uL (ref 3.77–5.28)
RDW: 13.5 % (ref 11.7–15.4)
WBC: 9.2 10*3/uL (ref 3.4–10.8)

## 2020-10-13 LAB — LIPID PANEL W/O CHOL/HDL RATIO
Cholesterol, Total: 201 mg/dL — ABNORMAL HIGH (ref 100–199)
HDL: 53 mg/dL (ref >39)
LDL Chol Calc (NIH): 126 mg/dL — ABNORMAL HIGH (ref 0–99)
Triglycerides: 126 mg/dL (ref 0–149)
VLDL Cholesterol Cal: 22 mg/dL (ref 5–40)

## 2020-10-13 LAB — COMPREHENSIVE METABOLIC PANEL
ALT: 21 IU/L (ref 0–32)
AST: 20 IU/L (ref 0–40)
Albumin/Globulin Ratio: 1.5 (ref 1.2–2.2)
Albumin: 4.1 g/dL (ref 3.8–4.8)
Alkaline Phosphatase: 88 IU/L (ref 44–121)
BUN/Creatinine Ratio: 16 (ref 9–23)
BUN: 14 mg/dL (ref 6–24)
Bilirubin Total: 0.3 mg/dL (ref 0.0–1.2)
CO2: 21 mmol/L (ref 20–29)
Calcium: 9.5 mg/dL (ref 8.7–10.2)
Chloride: 101 mmol/L (ref 96–106)
Creatinine, Ser: 0.88 mg/dL (ref 0.57–1.00)
Globulin, Total: 2.8 g/dL (ref 1.5–4.5)
Glucose: 96 mg/dL (ref 65–99)
Potassium: 4 mmol/L (ref 3.5–5.2)
Sodium: 140 mmol/L (ref 134–144)
Total Protein: 6.9 g/dL (ref 6.0–8.5)
eGFR: 80 mL/min/{1.73_m2} (ref >59)

## 2020-10-13 LAB — TSH: TSH: 1.46 u[IU]/mL (ref 0.450–4.500)

## 2020-10-13 LAB — HEPATITIS C ANTIBODY: Hep C Virus Ab: <0.1 s/co ratio (ref 0.0–0.9)

## 2020-10-13 LAB — VITAMIN B12: Vitamin B-12: 555 pg/mL (ref 232–1245)

## 2020-10-13 LAB — VITAMIN D 25 HYDROXY (VIT D DEFICIENCY, FRACTURES): Vit D, 25-Hydroxy: 65 ng/mL (ref 30.0–100.0)

## 2020-10-13 NOTE — Telephone Encounter (Signed)
Requested Prescriptions  Pending Prescriptions Disp Refills  . ARMOUR THYROID 90 MG tablet [Pharmacy Med Name: ARMOUR THYROID TABS 1&1/2GR 90MG ] 90 tablet 3    Sig: TAKE 1 TABLET DAILY     Endocrinology:  Hypothyroid Agents Failed - 10/13/2020 12:16 AM      Failed - TSH needs to be rechecked within 3 months after an abnormal result. Refill until TSH is due.      Passed - TSH in normal range and within 360 days    TSH  Date Value Ref Range Status  04/13/2020 2.170 0.450 - 4.500 uIU/mL Final         Passed - Valid encounter within last 12 months    Recent Outpatient Visits          Yesterday Routine general medical examination at a health care facility   [facility], [facility], DO   6 months ago Diet-controlled diabetes mellitus [facility])   [facility] [place], Megan P, DO   7 months ago COVID-19   [facility], [place], DO   7 months ago COVID-19   [facility], [place], DO   9 months ago Benign hypertensive renal disease   [facility] [place], [place], DO      Future Appointments            In 6 months Oralia Rud, Laural Benes, DO Oralia Rud, [facility]

## 2020-10-17 NOTE — Assessment & Plan Note (Signed)
Better on recheck. Continue diet and exercise. Call if BP starts running high at home. Continue to monitor.

## 2020-10-17 NOTE — Assessment & Plan Note (Signed)
Up slightly with A1c of 6.7 from 6.1- really work on diet and exercise. Recheck 3-6 months. Call with any concerns.

## 2020-10-17 NOTE — Assessment & Plan Note (Signed)
Rechecking labs today. Await results. Treat as needed.  °

## 2020-11-03 DIAGNOSIS — H40003 Preglaucoma, unspecified, bilateral: Secondary | ICD-10-CM | POA: Diagnosis not present

## 2021-02-08 ENCOUNTER — Ambulatory Visit (INDEPENDENT_AMBULATORY_CARE_PROVIDER_SITE_OTHER): Payer: BC Managed Care – PPO | Admitting: Family Medicine

## 2021-02-08 ENCOUNTER — Encounter: Payer: Self-pay | Admitting: Family Medicine

## 2021-02-08 ENCOUNTER — Other Ambulatory Visit: Payer: Self-pay

## 2021-02-08 VITALS — BP 129/66 | HR 72 | Temp 98.8°F | Ht 66.7 in | Wt 255.2 lb

## 2021-02-08 DIAGNOSIS — E119 Type 2 diabetes mellitus without complications: Secondary | ICD-10-CM | POA: Diagnosis not present

## 2021-02-08 DIAGNOSIS — Z1231 Encounter for screening mammogram for malignant neoplasm of breast: Secondary | ICD-10-CM

## 2021-02-08 DIAGNOSIS — M5431 Sciatica, right side: Secondary | ICD-10-CM | POA: Diagnosis not present

## 2021-02-08 LAB — BAYER DCA HB A1C WAIVED: HB A1C (BAYER DCA - WAIVED): 6.4 % (ref <7.0)

## 2021-02-08 MED ORDER — CYCLOBENZAPRINE HCL 10 MG PO TABS: 10.0000 mg | ORAL_TABLET | Freq: Every day | ORAL | 0 refills | Status: DC

## 2021-02-08 MED ORDER — NAPROXEN 500 MG PO TABS: 500.0000 mg | ORAL_TABLET | Freq: Two times a day (BID) | ORAL | 1 refills | Status: DC

## 2021-02-08 NOTE — Patient Instructions (Signed)
Call to schedule your mammogram: [facility]  Address: [address], [place], [place] [address]  Phone: [phone]  

## 2021-02-08 NOTE — Assessment & Plan Note (Signed)
Doing great with A1c of 6.4 down from 6.7. Continue diet and exercise. Continue to monitor.

## 2021-02-08 NOTE — Progress Notes (Signed)
BP 129/66   Pulse 72   Temp 98.8 F (37.1 C) (Oral)   Ht 5' 6.7" (1.694 m)   Wt 255 lb 3.2 oz (115.8 kg)   SpO2 97%   BMI 40.33 kg/m    Subjective:    Patient ID: Teresa Peters, female    DOB: 1970/11/25, 50 y.o.   MRN: [id number]  HPI: Teresa Peters is a 50 y.o. female  Chief Complaint  Patient presents with   Diabetes    Pt states she thinks her blood sugars have been elevated lately, eye exam requested   Pain    Pt states she has noticed some pain in her R hip area that comes and goes.    DIABETES Hypoglycemic episodes:no Polydipsia/polyuria: no Visual disturbance: no Chest pain: no Paresthesias: yes Glucose Monitoring: no  Accucheck frequency: Not Checking Taking Insulin?: no Blood Pressure Monitoring: not checking Retinal Examination: Up to Date Foot Exam: Up to Date Diabetic Education: Completed Pneumovax:  Refused Influenza: Refused Aspirin: no  HIP PAIN Duration: 2 weeks Involved hip: right  Mechanism of injury: unknown Location: into her R thigh wrapping around Onset: sudden  Severity: mild  Quality: cramping Frequency: intermittent Radiation: yes Aggravating factors: sitting on the toilet   Alleviating factors: nothing  Status: better Treatments attempted: rest and HEP   Relief with NSAIDs?: No NSAIDs Taken Weakness with weight bearing: no Weakness with walking: no Paresthesias / decreased sensation: yes Swelling: no Redness:no Fevers: no  Relevant past medical, surgical, family and social history reviewed and updated as indicated. Interim medical history since our last visit reviewed. Allergies and medications reviewed and updated.  Review of Systems  Constitutional: Negative.   Respiratory: Negative.    Cardiovascular: Negative.   Gastrointestinal: Negative.   Musculoskeletal:  Positive for myalgias. Negative for arthralgias, back pain, gait problem, joint swelling, neck pain and neck stiffness.  Skin: Negative.    Neurological:  Positive for numbness. Negative for dizziness, tremors, seizures, syncope, facial asymmetry, speech difficulty, weakness, light-headedness and headaches.  Psychiatric/Behavioral: Negative.     Per HPI unless specifically indicated above     Objective:    BP 129/66   Pulse 72   Temp 98.8 F (37.1 C) (Oral)   Ht 5' 6.7" (1.694 m)   Wt 255 lb 3.2 oz (115.8 kg)   SpO2 97%   BMI 40.33 kg/m   Wt Readings from Last 3 Encounters:  02/08/21 255 lb 3.2 oz (115.8 kg)  10/12/20 243 lb (110.2 kg)  04/13/20 239 lb (108.4 kg)    Physical Exam Vitals and nursing note reviewed.  Constitutional:      General: She is not in acute distress.    Appearance: Normal appearance. She is not ill-appearing, toxic-appearing or diaphoretic.  HENT:     Head: Normocephalic and atraumatic.     Right Ear: External ear normal.     Left Ear: External ear normal.     Nose: Nose normal.     Mouth/Throat:     Mouth: Mucous membranes are moist.     Pharynx: Oropharynx is clear.  Eyes:     General: No scleral icterus.       Right eye: No discharge.        Left eye: No discharge.     Extraocular Movements: Extraocular movements intact.     Conjunctiva/sclera: Conjunctivae normal.     Pupils: Pupils are equal, round, and reactive to light.  Cardiovascular:     Rate and Rhythm: Normal  rate and regular rhythm.     Pulses: Normal pulses.     Heart sounds: Normal heart sounds. No murmur heard.   No friction rub. No gallop.  Pulmonary:     Effort: Pulmonary effort is normal. No respiratory distress.     Breath sounds: Normal breath sounds. No stridor. No wheezing, rhonchi or rales.  Chest:     Chest wall: No tenderness.  Musculoskeletal:        General: Normal range of motion.     Cervical back: Normal range of motion and neck supple.  Skin:    General: Skin is warm and dry.     Capillary Refill: Capillary refill takes less than 2 seconds.     Coloration: Skin is not jaundiced or pale.      Findings: No bruising, erythema, lesion or rash.  Neurological:     General: No focal deficit present.     Mental Status: She is alert and oriented to person, place, and time. Mental status is at baseline.  Psychiatric:        Mood and Affect: Mood normal.        Behavior: Behavior normal.        Thought Content: Thought content normal.        Judgment: Judgment normal.    Results for orders placed or performed in visit on 10/12/20  Microscopic Examination   BLD  Result Value Ref Range   WBC, UA None seen 0 - 5 /hpf   RBC None seen 0 - 2 /hpf   Epithelial Cells (non renal) 0-10 0 - 10 /hpf   Bacteria, UA None seen None seen/Few  Bayer DCA Hb A1c Waived  Result Value Ref Range   HB A1C (BAYER DCA - WAIVED) 6.7 <7.0 %  Microalbumin, Urine Waived  Result Value Ref Range   Microalb, Ur Waived 10 0 - 19 mg/L   Creatinine, Urine Waived 200 10 - 300 mg/dL   Microalb/Creat Ratio <30 <30 mg/g  CBC with Differential/Platelet  Result Value Ref Range   WBC 9.2 3.4 - 10.8 x10E3/uL   RBC 4.83 3.77 - 5.28 x10E6/uL   Hemoglobin 13.6 11.1 - 15.9 g/dL   Hematocrit 41.1 34.0 - 46.6 %   MCV 85 79 - 97 fL   MCH 28.2 26.6 - 33.0 pg   MCHC 33.1 31.5 - 35.7 g/dL   RDW 13.5 11.7 - 15.4 %   Platelets 271 150 - 450 x10E3/uL   Neutrophils 61 Not Estab. %   Lymphs 29 Not Estab. %   Monocytes 5 Not Estab. %   Eos 4 Not Estab. %   Basos 1 Not Estab. %   Neutrophils Absolute 5.6 1.4 - 7.0 x10E3/uL   Lymphocytes Absolute 2.6 0.7 - 3.1 x10E3/uL   Monocytes Absolute 0.5 0.1 - 0.9 x10E3/uL   EOS (ABSOLUTE) 0.4 0.0 - 0.4 x10E3/uL   Basophils Absolute 0.1 0.0 - 0.2 x10E3/uL   Immature Granulocytes 0 Not Estab. %   Immature Grans (Abs) 0.0 0.0 - 0.1 x10E3/uL  Comprehensive metabolic panel  Result Value Ref Range   Glucose 96 65 - 99 mg/dL   BUN 14 6 - 24 mg/dL   Creatinine, Ser 0.88 0.57 - 1.00 mg/dL   eGFR 80 >59 mL/min/1.73   BUN/Creatinine Ratio 16 9 - 23   Sodium 140 134 - 144 mmol/L    Potassium 4.0 3.5 - 5.2 mmol/L   Chloride 101 96 - 106 mmol/L   CO2 21 20 -  29 mmol/L   Calcium 9.5 8.7 - 10.2 mg/dL   Total Protein 6.9 6.0 - 8.5 g/dL   Albumin 4.1 3.8 - 4.8 g/dL   Globulin, Total 2.8 1.5 - 4.5 g/dL   Albumin/Globulin Ratio 1.5 1.2 - 2.2   Bilirubin Total 0.3 0.0 - 1.2 mg/dL   Alkaline Phosphatase 88 44 - 121 IU/L   AST 20 0 - 40 IU/L   ALT 21 0 - 32 IU/L  Lipid Panel w/o Chol/HDL Ratio  Result Value Ref Range   Cholesterol, Total 201 (H) 100 - 199 mg/dL   Triglycerides 126 0 - 149 mg/dL   HDL 53 >39 mg/dL   VLDL Cholesterol Cal 22 5 - 40 mg/dL   LDL Chol Calc (NIH) 126 (H) 0 - 99 mg/dL  Urinalysis, Routine w reflex microscopic  Result Value Ref Range   Specific Gravity, UA 1.015 1.005 - 1.030   pH, UA 5.5 5.0 - 7.5   Color, UA Yellow Yellow   Appearance Ur Clear Clear   Leukocytes,UA Trace (A) Negative   Protein,UA Negative Negative/Trace   Glucose, UA Negative Negative   Ketones, UA Negative Negative   RBC, UA Trace (A) Negative   Bilirubin, UA Negative Negative   Urobilinogen, Ur 0.2 0.2 - 1.0 mg/dL   Nitrite, UA Negative Negative   Microscopic Examination See below:   TSH  Result Value Ref Range   TSH 1.460 0.450 - 4.500 uIU/mL  B12  Result Value Ref Range   Vitamin B-12 555 232 - 1,245 pg/mL  VITAMIN D 25 Hydroxy (Vit-D Deficiency, Fractures)  Result Value Ref Range   Vit D, 25-Hydroxy 65.0 30.0 - 100.0 ng/mL  Hepatitis C Antibody  Result Value Ref Range   Hep C Virus Ab <0.1 0.0 - 0.9 s/co ratio      Assessment & Plan:   Problem List Items Addressed This Visit       Endocrine   Diet-controlled diabetes mellitus ([place]) - Primary    Doing great with A1c of 6.4 down from 6.7. Continue diet and exercise. Continue to monitor.        Relevant Orders   Bayer DCA Hb A1c Waived   Other Visit Diagnoses     Sciatica of right side       Will treat with stretches, flexeril and naproxen. Call if not getting better or getting worse. Continue  to monitor.    Relevant Medications   cyclobenzaprine (FLEXERIL) 10 MG tablet   Encounter for screening mammogram for malignant neoplasm of breast       Mammogram ordered.    Relevant Orders   MM DIAG BREAST TOMO BILATERAL        Follow up plan: Return in about 3 months (around 05/11/2021).

## 2021-02-09 ENCOUNTER — Ambulatory Visit: Payer: Self-pay | Admitting: *Deleted

## 2021-02-09 DIAGNOSIS — M79651 Pain in right thigh: Secondary | ICD-10-CM

## 2021-02-09 NOTE — Telephone Encounter (Signed)
Reason for Disposition  [1] Thigh or calf pain AND [2] only 1 side AND [3] present > 1 hour (Exception: chronic unchanged pain)  Answer Assessment - Initial Assessment Questions 1. ONSET: "When did the pain start?"      Yesterday got worse 2. LOCATION: "Where is the pain located?"      R thigh pain- hip to groin 3. PAIN: "How bad is the pain?"    (Scale 1-10; or mild, moderate, severe)   -  MILD (1-3): doesn't interfere with normal activities    -  MODERATE (4-7): interferes with normal activities (e.g., work or school) or awakens from sleep, limping    -  SEVERE (8-10): excruciating pain, unable to do any normal activities, unable to walk     Mild/moderate- tender to touch and inner vein seems to be inflamed 4. WORK OR EXERCISE: "Has there been any recent work or exercise that involved this part of the body?"      no 5. CAUSE: "What do you think is causing the leg pain?"     Patient thinks she may be getting phlebitis or blood clot 6. OTHER SYMPTOMS: "Do you have any other symptoms?" (e.g., chest pain, back pain, breathing difficulty, swelling, rash, fever, numbness, weakness)     no  Protocols used: Leg Pain-A-AH

## 2021-02-09 NOTE — Telephone Encounter (Signed)
Patient is calling to report she was in the office yesterday and she had discussed leg /thigh pain with PCP. Patient states she had severe cramping in R upper thigh yesterday evening- "like Carlie horse in calve- but in thigh" Patient states the which made it hard to move her leg./stand. Pain radiated from the knee to hip and groin. Pain has eased and relaxed and patient is hydrating- but she is concerned that the thigh is tender to touch and she still feels that the vein is inflamed. Advised patient would send information to PCP- she may need to have further investigation for symptom.

## 2021-02-10 NOTE — Telephone Encounter (Signed)
This is a new symptom. She can go get an x-ray to look for issues with her back- order in. I can also send her to PT if she'd like

## 2021-02-10 NOTE — Addendum Note (Signed)
Addended by: Dorcas Carrow on: 02/10/2021 04:38 PM   Modules accepted: Orders

## 2021-02-10 NOTE — Telephone Encounter (Signed)
Patient notified.   Will hold off on PT right now.

## 2021-04-07 ENCOUNTER — Encounter: Payer: Self-pay | Admitting: Emergency Medicine

## 2021-04-07 ENCOUNTER — Other Ambulatory Visit: Payer: Self-pay

## 2021-04-07 ENCOUNTER — Ambulatory Visit: Payer: Self-pay | Admitting: *Deleted

## 2021-04-07 ENCOUNTER — Ambulatory Visit
Admission: EM | Admit: 2021-04-07 | Discharge: 2021-04-07 | Disposition: A | Discharge status: Home / Self Care | Payer: BC Managed Care – PPO | Attending: Physician Assistant | Admitting: Physician Assistant

## 2021-04-07 ENCOUNTER — Ambulatory Visit (INDEPENDENT_AMBULATORY_CARE_PROVIDER_SITE_OTHER): Payer: BC Managed Care – PPO

## 2021-04-07 DIAGNOSIS — N3000 Acute cystitis without hematuria: Secondary | ICD-10-CM | POA: Diagnosis not present

## 2021-04-07 DIAGNOSIS — K5909 Other constipation: Secondary | ICD-10-CM | POA: Insufficient documentation

## 2021-04-07 DIAGNOSIS — R1012 Left upper quadrant pain: Secondary | ICD-10-CM

## 2021-04-07 DIAGNOSIS — K5900 Constipation, unspecified: Secondary | ICD-10-CM

## 2021-04-07 LAB — URINALYSIS, COMPLETE (UACMP) WITH MICROSCOPIC
Bilirubin Urine: NEGATIVE
Glucose, UA: NEGATIVE mg/dL
Hgb urine dipstick: NEGATIVE
Ketones, ur: NEGATIVE mg/dL
Nitrite: NEGATIVE
Protein, ur: NEGATIVE mg/dL
Specific Gravity, Urine: <1.005 — ABNORMAL LOW (ref 1.005–1.030)
pH: 6 (ref 5.0–8.0)

## 2021-04-07 MED ORDER — NITROFURANTOIN MONOHYD MACRO 100 MG PO CAPS: 100.0000 mg | ORAL_CAPSULE | Freq: Two times a day (BID) | ORAL | 0 refills | Status: DC

## 2021-04-07 NOTE — Telephone Encounter (Signed)
Pt states did go to UC, has UTI and placed on MAcrobid. Also reports imaging showed stool, moderate amount..  IMPRESSION: Moderate stool burden in the right hemiabdomen without evidence of CT occult obstruction.  Assured pt NT would alert Dr. Laural Benes of encounter and findings.

## 2021-04-07 NOTE — Discharge Instructions (Addendum)
Continue taking the tea to help you empty out the rest of your colon, or drink a bottle of Magnesium Citrate.  Take Magnesium Citrate capsules or pills 400 mg at bed time to prevent constipation once you are empty.

## 2021-04-07 NOTE — ED Provider Notes (Signed)
[facility]    CSN: [id number] Arrival date & time: 04/07/21  1606      History   Chief Complaint Chief Complaint  Patient presents with   Abdominal Pain    upper    HPI Teresa Peters is a 50 y.o. female with LUQ pain that is worse when she beds over. Has chronic constipation and drank a laxative tea 4 days ago and helped her empty out and helped with the bloating she had been feeling. Had another BM again this am and had formed stool.  Had colonoscopy 2020, also had polyps in her stomach 2020.  Last week she thought she was having some UTI symptoms since her urine flow had changes and thought she just needed to drink more, so she has the past 2 days and her urine flow is back to normal.   Past Medical History:  Diagnosis Date   Anemia    Cellulitis    Diabetes mellitus without complication (HCC)    Fatty liver    Hypothyroidism    Obesity    Prediabetes     Patient Active Problem List   Diagnosis Date Noted   Paraosmia 10/12/2020   Benign hypertensive renal disease 12/24/2019   Diet-controlled diabetes mellitus (HCC) 09/06/2019   TMJ (temporomandibular joint disorder) 04/15/2019   Gastric polyp    Hemorrhoids without complication    Acquired hypothyroidism 01/06/2019   Change in voice 01/06/2019   Biliary sludge determined by ultrasound 08/25/2018   Obesity 08/15/2018   Varicose veins of both legs with edema 08/15/2018   Vitamin B 12 deficiency 08/15/2018   Vitamin D deficiency 08/15/2018   Mild anemia 06/10/2018    Past Surgical History:  Procedure Laterality Date   ADENOIDECTOMY     ADENOIDECTOMY     COLONOSCOPY WITH PROPOFOL N/A 01/14/2019   Procedure: COLONOSCOPY WITH PROPOFOL;  Surgeon: Pasty Spillers, MD;  Location: [facility];  Service: Endoscopy;  Laterality: N/A;   ESOPHAGOGASTRODUODENOSCOPY  2012   polyps (pt states they were not removed)   ESOPHAGOGASTRODUODENOSCOPY (EGD) WITH PROPOFOL N/A 01/14/2019   Procedure:  ESOPHAGOGASTRODUODENOSCOPY (EGD) WITH BIOPSY;  Surgeon: Pasty Spillers, MD;  Location: [facility];  Service: Endoscopy;  Laterality: N/A;   FOOT SURGERY     POLYPECTOMY N/A 01/14/2019   Procedure: POLYPECTOMY;  Surgeon: Pasty Spillers, MD;  Location: [facility];  Service: Endoscopy;  Laterality: N/A;  Stomach    OB History     Gravida  4   Para  4   Term  4   Preterm      AB      Living  4      SAB      IAB      Ectopic      Multiple      Live Births  4            Home Medications    Prior to Admission medications   Medication Sig Start Date End Date Taking? Authorizing Provider  ARMOUR THYROID 90 MG tablet TAKE 1 TABLET DAILY 10/13/20  Yes Johnson, Megan P, DO  B Complex Vitamins (VITAMIN B COMPLEX PO) Take by mouth daily.   Yes [provider]  Menaquinone-7 (VITAMIN K2 PO) Take by mouth daily.   Yes [provider]  nitrofurantoin, macrocrystal-monohydrate, (MACROBID) 100 MG capsule Take 1 capsule (100 mg total) by mouth 2 (two) times daily. 04/07/21  Yes Rodriguez-Southworth, Nettie Elm, PA-C  VITAMIN D PO  Take by mouth daily.   Yes [provider]    Family History Family History  Problem Relation Age of Onset   Glaucoma Mother    Cataracts Mother    Hypertension Father    Fibromyalgia Sister    Obesity Sister    Thyroid disease Son    Raynaud syndrome Paternal Grandmother    Diabetes Mellitus II Paternal Grandfather    Diabetes Paternal Grandfather     Social History Social History   Tobacco Use   Smoking status: Never   Smokeless tobacco: Never  Vaping Use   Vaping Use: Never used  Substance Use Topics   Alcohol use: Yes    Comment: rare - 1x/month   Drug use: No     Allergies   Metformin and related   Review of Systems Review of Systems  Constitutional:  Negative for appetite change, chills, diaphoresis and fever.  Gastrointestinal:  Positive for abdominal pain and  constipation.  Genitourinary:  Positive for frequency. Negative for dysuria and urgency.  Skin:  Negative for rash.    Physical Exam Triage Vital Signs ED Triage Vitals  Enc Vitals Group     BP 04/07/21 1629 (!) 156/71     Pulse Rate 04/07/21 1629 74     Resp 04/07/21 1629 18     Temp 04/07/21 1629 98.8 F (37.1 C)     Temp Source 04/07/21 1629 Oral     SpO2 04/07/21 1629 100 %     Weight 04/07/21 1625 255 lb 4.7 oz (115.8 kg)     Height 04/07/21 1625 5' 6.7" (1.694 m)     Head Circumference --      Peak Flow --      Pain Score 04/07/21 1625 5     Pain Loc --      Pain Edu? --      Excl. in GC? --    No data found.  Updated Vital Signs BP (!) 156/71 (BP Location: Left Arm)   Pulse 74   Temp 98.8 F (37.1 C) (Oral)   Resp 18   Ht 5' 6.7" (1.694 m)   Wt 255 lb 4.7 oz (115.8 kg)   LMP 03/17/2021 (Approximate)   SpO2 100%   BMI 40.34 kg/m   Visual Acuity Right Eye Distance:   Left Eye Distance:   Bilateral Distance:    Right Eye Near:   Left Eye Near:    Bilateral Near:     Physical Exam Constitutional:      General: She is not in acute distress.    Appearance: She is obese. She is not toxic-appearing.  Pulmonary:     Effort: Pulmonary effort is normal.  Abdominal:     General: Abdomen is protuberant. Bowel sounds are normal.     Palpations: Abdomen is soft.     Tenderness: There is abdominal tenderness in the left upper quadrant. There is no guarding or rebound.  Skin:    General: Skin is warm and dry.  Neurological:     Mental Status: She is alert and oriented to person, place, and time.  Psychiatric:        Mood and Affect: Mood normal.        Behavior: Behavior normal.     UC Treatments / Results  Labs (all labs ordered are listed, but only abnormal results are displayed) Labs Reviewed  URINALYSIS, COMPLETE (UACMP) WITH MICROSCOPIC - Abnormal; Notable for the following components:      Result Value  Color, Urine STRAW (*)    APPearance HAZY  (*)    Specific Gravity, Urine <1.005 (*)    Leukocytes,Ua MODERATE (*)    Bacteria, UA FEW (*)    All other components within normal limits  URINE CULTURE    EKG   Radiology DG Abd 2 Views  Result Date: 04/07/2021 CLINICAL DATA:  Left upper quadrant pain, history of constipation, bloating EXAM: ABDOMEN - 2 VIEW COMPARISON:  None. FINDINGS: There is a moderate stool burden primarily in the right hemiabdomen. There is no evidence of mechanical bowel obstruction. There is no free intraperitoneal air. There is no gross organomegaly or abnormal soft tissue calcification. There is no acute osseous abnormality. IMPRESSION: Moderate stool burden in the right hemiabdomen without evidence of CT occult obstruction. Electronically Signed   By: Lesia Hausen M.D.   On: 04/07/2021 18:09    Procedures Procedures (including critical care time)  Medications Ordered in UC Medications - No data to display  Initial Impression / Assessment and Plan / UC Course  I have reviewed the triage vital signs and the nursing notes. Pertinent labs & imaging results that were available during my care of the patient were reviewed by me and considered in my medical decision making (see chart for details). UTI- was placed on Macrobid as noted. Urine culture was sent out.  Constipation - chronic. Abdominal pain may be from the constipation ,but after she empties out and the pain continues, needs to Fu with her Gi Dr.  See instructions.     Final Clinical Impressions(s) / UC Diagnoses   Final diagnoses:  Acute cystitis without hematuria  Chronic constipation     Discharge Instructions      Continue taking the tea to help you empty out the rest of your colon, or drink a bottle of Magnesium Citrate.  Take Magnesium Citrate capsules or pills 400 mg at bed time to prevent constipation once you are empty.      ED Prescriptions     Medication Sig Dispense Auth. Provider   nitrofurantoin,  macrocrystal-monohydrate, (MACROBID) 100 MG capsule Take 1 capsule (100 mg total) by mouth 2 (two) times daily. 10 capsule Rodriguez-Southworth, Nettie Elm, PA-C      PDMP not reviewed this encounter.   Garey Ham, Cordelia Poche 04/07/21 1858

## 2021-04-07 NOTE — Telephone Encounter (Addendum)
Message from [place] A Pawlus sent at 04/07/2021  3:33 PM EDT  Summary: advice   Pt needed advice and believes she may be having issues with her pancreas, that it may be inflamed and wanted to know if she should go to UC or wait to schedule with her PCP.         TC to patient. No answer-left VM to call back.  5:00 Attempted to reach patient. No answer, left VM again to return call.

## 2021-04-07 NOTE — ED Triage Notes (Signed)
Pt c/o upper abdominal pain. She states it feels bloated and feels like a knot is in the area. She states she has periods of constipation and diarrhea at her baseline. Started about a month ago but the pain is worse today.

## 2021-04-08 NOTE — Telephone Encounter (Signed)
Noted. We can discuss at her appt next week

## 2021-04-09 LAB — URINE CULTURE

## 2021-04-14 ENCOUNTER — Ambulatory Visit (INDEPENDENT_AMBULATORY_CARE_PROVIDER_SITE_OTHER): Payer: BC Managed Care – PPO | Admitting: Family Medicine

## 2021-04-14 ENCOUNTER — Encounter: Payer: Self-pay | Admitting: Family Medicine

## 2021-04-14 ENCOUNTER — Other Ambulatory Visit: Payer: Self-pay

## 2021-04-14 VITALS — BP 146/82 | HR 67 | Ht 66.0 in | Wt 255.0 lb

## 2021-04-14 DIAGNOSIS — M722 Plantar fascial fibromatosis: Secondary | ICD-10-CM

## 2021-04-14 DIAGNOSIS — M2550 Pain in unspecified joint: Secondary | ICD-10-CM

## 2021-04-14 DIAGNOSIS — U099 Post covid-19 condition, unspecified: Secondary | ICD-10-CM | POA: Diagnosis not present

## 2021-04-14 DIAGNOSIS — Z124 Encounter for screening for malignant neoplasm of cervix: Secondary | ICD-10-CM

## 2021-04-14 NOTE — Progress Notes (Signed)
BP (!) 146/82   Pulse 67   Ht 5\' 6"  (1.676 m)   Wt 255 lb (115.7 kg)   LMP 03/17/2021 (Approximate)   BMI 41.16 kg/m    Subjective:    Patient ID: 03/19/2021, female    DOB: Nov 11, 1970, 50 y.o.   MRN: [id number]  HPI: Sharline Lehane is a 50 y.o. female  Chief Complaint  Patient presents with   Gynecologic Exam   Here today for her pap smear. Feeling well. No concerns. She does note that she has been having a lot of problems with her L foot. She has plantar fasciitis and has had issues with it before. It is hurting and keeping her from walking, but that is hurting her and making her ache. She would like to see PT to see if they can help. Would like to hold on podiatry right now. She continues with a lot of issues from COVID. Will let 44 know if she'd like to see the COVID clinic.   Relevant past medical, surgical, family and social history reviewed and updated as indicated. Interim medical history since our last visit reviewed. Allergies and medications reviewed and updated.  Review of Systems  Constitutional: Negative.   Respiratory: Negative.    Cardiovascular: Negative.   Gastrointestinal: Negative.   Musculoskeletal:  Positive for arthralgias, gait problem and myalgias. Negative for back pain, joint swelling, neck pain and neck stiffness.   Per HPI unless specifically indicated above     Objective:    BP (!) 146/82   Pulse 67   Ht 5\' 6"  (1.676 m)   Wt 255 lb (115.7 kg)   LMP 03/17/2021 (Approximate)   BMI 41.16 kg/m   Wt Readings from Last 3 Encounters:  04/14/21 255 lb (115.7 kg)  04/07/21 255 lb 4.7 oz (115.8 kg)  02/08/21 255 lb 3.2 oz (115.8 kg)    Physical Exam Vitals and nursing note reviewed.  Constitutional:      General: She is not in acute distress.    Appearance: Normal appearance. She is not ill-appearing, toxic-appearing or diaphoretic.  HENT:     Head: Normocephalic and atraumatic.     Right Ear: External ear normal.     Left  Ear: External ear normal.     Nose: Nose normal.     Mouth/Throat:     Mouth: Mucous membranes are moist.     Pharynx: Oropharynx is clear.  Eyes:     General: No scleral icterus.       Right eye: No discharge.        Left eye: No discharge.     Extraocular Movements: Extraocular movements intact.     Conjunctiva/sclera: Conjunctivae normal.     Pupils: Pupils are equal, round, and reactive to light.  Cardiovascular:     Rate and Rhythm: Normal rate and regular rhythm.     Pulses: Normal pulses.     Heart sounds: Normal heart sounds. No murmur heard.   No friction rub. No gallop.  Pulmonary:     Effort: Pulmonary effort is normal. No respiratory distress.     Breath sounds: Normal breath sounds. No stridor. No wheezing, rhonchi or rales.  Chest:     Chest wall: No tenderness.  Genitourinary:    Labia:        Right: No rash, tenderness, lesion or injury.        Left: No rash, tenderness, lesion or injury.      Urethra: No prolapse, urethral  pain, urethral swelling or urethral lesion.     Vagina: Normal.     Cervix: Normal.     Uterus: Normal.      Adnexa: Right adnexa normal and left adnexa normal.  Musculoskeletal:        General: Normal range of motion.     Cervical back: Normal range of motion and neck supple.  Skin:    General: Skin is warm and dry.     Capillary Refill: Capillary refill takes less than 2 seconds.     Coloration: Skin is not jaundiced or pale.     Findings: No bruising, erythema, lesion or rash.  Neurological:     General: No focal deficit present.     Mental Status: She is alert and oriented to person, place, and time. Mental status is at baseline.  Psychiatric:        Mood and Affect: Mood normal.        Behavior: Behavior normal.        Thought Content: Thought content normal.        Judgment: Judgment normal.    Results for orders placed or performed during the hospital encounter of 04/07/21  Urine Culture   Specimen: Urine, Clean Catch   Result Value Ref Range   Specimen Description      URINE, CLEAN CATCH Performed at [facility], [address], [place], [place] [address]    Special Requests      NONE Performed at [facility], [address] [place], [place] [address]    Culture MULTIPLE SPECIES PRESENT, SUGGEST RECOLLECTION (A)    Report Status 04/09/2021 FINAL   Urinalysis, Complete w Microscopic Urine, Clean Catch  Result Value Ref Range   Color, Urine STRAW (A) YELLOW   APPearance HAZY (A) CLEAR   Specific Gravity, Urine <1.005 (L) 1.005 - 1.030   pH 6.0 5.0 - 8.0   Glucose, UA NEGATIVE NEGATIVE mg/dL   Hgb urine dipstick NEGATIVE NEGATIVE   Bilirubin Urine NEGATIVE NEGATIVE   Ketones, ur NEGATIVE NEGATIVE mg/dL   Protein, ur NEGATIVE NEGATIVE mg/dL   Nitrite NEGATIVE NEGATIVE   Leukocytes,Ua MODERATE (A) NEGATIVE   Squamous Epithelial / LPF 0-5 0 - 5   WBC, UA 11-20 0 - 5 WBC/hpf   RBC / HPF 0-5 0 - 5 RBC/hpf   Bacteria, UA FEW (A) NONE SEEN   Budding Yeast PRESENT       Assessment & Plan:   Problem List Items Addressed This Visit       Other   Post-COVID syndrome    Still having significant issues with brain fog, loss of taste and smell and joint pain and fatigue. Offered referral to post-covid clinic. She will consider and let us know if she'd like to go.      Other Visit Diagnoses     Screening for cervical cancer    -  Primary   Pap done today. Await results.    Relevant Orders   Cytology - PAP   Plantar fasciitis       Declines podiatry right now. Will refer to PT. Call with any concerns. Continue to monitor.    Relevant Orders   Ambulatory referral to Physical Therapy   Arthralgia, unspecified joint       Will refer to PT. Call with any concerns. Continue to monitor.    Relevant Orders   Ambulatory referral to Physical Therapy        Follow up plan:  Return As scheduled.

## 2021-04-14 NOTE — Assessment & Plan Note (Signed)
Still having significant issues with brain fog, loss of taste and smell and joint pain and fatigue. Offered referral to post-covid clinic. She will consider and let us know if she'd like to go.

## 2021-04-20 LAB — CYTOLOGY - PAP
Comment: NEGATIVE
Diagnosis: UNDETERMINED — AB
High risk HPV: NEGATIVE

## 2021-04-22 ENCOUNTER — Encounter: Payer: Self-pay | Admitting: Family Medicine

## 2021-05-11 ENCOUNTER — Ambulatory Visit (INDEPENDENT_AMBULATORY_CARE_PROVIDER_SITE_OTHER): Payer: BC Managed Care – PPO | Admitting: Family Medicine

## 2021-05-11 ENCOUNTER — Other Ambulatory Visit: Payer: Self-pay

## 2021-05-11 ENCOUNTER — Encounter: Payer: Self-pay | Admitting: Family Medicine

## 2021-05-11 VITALS — BP 146/86 | HR 61 | Temp 98.2°F | Ht 67.0 in | Wt 259.8 lb

## 2021-05-11 DIAGNOSIS — I129 Hypertensive chronic kidney disease with stage 1 through stage 4 chronic kidney disease, or unspecified chronic kidney disease: Secondary | ICD-10-CM | POA: Diagnosis not present

## 2021-05-11 DIAGNOSIS — E119 Type 2 diabetes mellitus without complications: Secondary | ICD-10-CM | POA: Diagnosis not present

## 2021-05-11 DIAGNOSIS — E039 Hypothyroidism, unspecified: Secondary | ICD-10-CM

## 2021-05-11 LAB — URINALYSIS, ROUTINE W REFLEX MICROSCOPIC
Bilirubin, UA: NEGATIVE
Glucose, UA: NEGATIVE
Ketones, UA: NEGATIVE
Leukocytes,UA: NEGATIVE
Nitrite, UA: NEGATIVE
Protein,UA: NEGATIVE
RBC, UA: NEGATIVE
Specific Gravity, UA: 1.015 (ref 1.005–1.030)
Urobilinogen, Ur: 0.2 mg/dL (ref 0.2–1.0)
pH, UA: 5.5 (ref 5.0–7.5)

## 2021-05-11 LAB — MICROALBUMIN, URINE WAIVED
Creatinine, Urine Waived: 50 mg/dL (ref 10–300)
Microalb, Ur Waived: 10 mg/L (ref 0–19)
Microalb/Creat Ratio: <30 mg/g (ref <30)

## 2021-05-11 LAB — BAYER DCA HB A1C WAIVED: HB A1C (BAYER DCA - WAIVED): 6.7 % — ABNORMAL HIGH (ref 4.8–5.6)

## 2021-05-11 NOTE — Assessment & Plan Note (Signed)
Stable with A1c of 6.7. Continue diet and exercise. Will recheck 3 months. Call with any concerns. Continue to monitor.

## 2021-05-11 NOTE — Assessment & Plan Note (Signed)
Rechecking labs today. Await results. Treat as needed. Call with any concerns.  

## 2021-05-11 NOTE — Progress Notes (Signed)
BP (!) 146/86   Pulse 61   Temp 98.2 F (36.8 C) (Oral)   Ht 5\' 7"  (1.702 m)   Wt 259 lb 12.8 oz (117.8 kg)   LMP 04/15/2021 (Exact Date)   SpO2 98%   BMI 40.69 kg/m    Subjective:    Patient ID: Teresa Peters, female    DOB: [birth year], 50 y.o.   MRN: [id number]  HPI: Teresa Peters is a 50 y.o. female  Chief Complaint  Patient presents with   Diabetes    Eye exam requested from Dr. Matilde Sprang   Hyperlipidemia   Hypertension   HYPERTENSION / HYPERLIPIDEMIA Satisfied with current treatment? yes Duration of hypertension: chronic BP monitoring frequency: not checking BP medication side effects: no Past BP meds: none Duration of hyperlipidemia: chronic Cholesterol medication side effects: not on anything Cholesterol supplements: none Past cholesterol medications: none Medication compliance: excellent compliance Aspirin: no Recent stressors: no Recurrent headaches: no Visual changes: no Palpitations: no Dyspnea: no Chest pain: no Lower extremity edema: no Dizzy/lightheaded: no  DIABETES Hypoglycemic episodes:no Polydipsia/polyuria: no Visual disturbance: no Chest pain: no Paresthesias: no Glucose Monitoring: yes Taking Insulin?: no Blood Pressure Monitoring: not checking Retinal Examination: Up to Date Foot Exam: Up to Date Diabetic Education: Completed Pneumovax:  Refused Influenza: Refused Aspirin: no  HYPOTHYROIDISM Thyroid control status:controlled Satisfied with current treatment? yes Medication side effects: no Medication compliance: excellent compliance Recent dose adjustment:no Fatigue: no Cold intolerance: no Heat intolerance: no Weight gain: no Weight loss: no Constipation: no Diarrhea/loose stools: no Palpitations: no Lower extremity edema: no Anxiety/depressed mood: no  Relevant past medical, surgical, family and social history reviewed and updated as indicated. Interim medical history since our last visit  reviewed. Allergies and medications reviewed and updated.  Review of Systems  Constitutional: Negative.   Respiratory: Negative.    Cardiovascular: Negative.   Gastrointestinal:  Positive for nausea. Negative for abdominal pain, anal bleeding, blood in stool, constipation, diarrhea, rectal pain and vomiting.  Musculoskeletal: Negative.   Skin: Negative.   Neurological: Negative.   Psychiatric/Behavioral: Negative.     Per HPI unless specifically indicated above     Objective:    BP (!) 146/86   Pulse 61   Temp 98.2 F (36.8 C) (Oral)   Ht 5\' 7"  (1.702 m)   Wt 259 lb 12.8 oz (117.8 kg)   LMP 04/15/2021 (Exact Date)   SpO2 98%   BMI 40.69 kg/m   Wt Readings from Last 3 Encounters:  05/11/21 259 lb 12.8 oz (117.8 kg)  04/14/21 255 lb (115.7 kg)  04/07/21 255 lb 4.7 oz (115.8 kg)    Physical Exam Vitals and nursing note reviewed.  Constitutional:      General: She is not in acute distress.    Appearance: Normal appearance. She is not ill-appearing, toxic-appearing or diaphoretic.  HENT:     Head: Normocephalic and atraumatic.     Right Ear: External ear normal.     Left Ear: External ear normal.     Nose: Nose normal.     Mouth/Throat:     Mouth: Mucous membranes are moist.     Pharynx: Oropharynx is clear.  Eyes:     General: No scleral icterus.       Right eye: No discharge.        Left eye: No discharge.     Extraocular Movements: Extraocular movements intact.     Conjunctiva/sclera: Conjunctivae normal.     Pupils: Pupils are equal, round,  and reactive to light.  Cardiovascular:     Rate and Rhythm: Normal rate and regular rhythm.     Pulses: Normal pulses.     Heart sounds: Normal heart sounds. No murmur heard.   No friction rub. No gallop.  Pulmonary:     Effort: Pulmonary effort is normal. No respiratory distress.     Breath sounds: Normal breath sounds. No stridor. No wheezing, rhonchi or rales.  Chest:     Chest wall: No tenderness.   Musculoskeletal:        General: Normal range of motion.     Cervical back: Normal range of motion and neck supple.  Skin:    General: Skin is warm and dry.     Capillary Refill: Capillary refill takes less than 2 seconds.     Coloration: Skin is not jaundiced or pale.     Findings: No bruising, erythema, lesion or rash.  Neurological:     General: No focal deficit present.     Mental Status: She is alert and oriented to person, place, and time. Mental status is at baseline.  Psychiatric:        Mood and Affect: Mood normal.        Behavior: Behavior normal.        Thought Content: Thought content normal.        Judgment: Judgment normal.    Results for orders placed or performed in visit on 05/11/21  Bayer DCA Hb A1c Waived  Result Value Ref Range   HB A1C (BAYER DCA - WAIVED) 6.7 (H) 4.8 - 5.6 %  Microalbumin, Urine Waived  Result Value Ref Range   Microalb, Ur Waived 10 0 - 19 mg/L   Creatinine, Urine Waived 50 10 - 300 mg/dL   Microalb/Creat Ratio <30 <30 mg/g  Urinalysis, Routine w reflex microscopic  Result Value Ref Range   Specific Gravity, UA 1.015 1.005 - 1.030   pH, UA 5.5 5.0 - 7.5   Color, UA Yellow Yellow   Appearance Ur Clear Clear   Leukocytes,UA Negative Negative   Protein,UA Negative Negative/Trace   Glucose, UA Negative Negative   Ketones, UA Negative Negative   RBC, UA Negative Negative   Bilirubin, UA Negative Negative   Urobilinogen, Ur 0.2 0.2 - 1.0 mg/dL   Nitrite, UA Negative Negative      Assessment & Plan:   Problem List Items Addressed This Visit       Endocrine   Acquired hypothyroidism    Rechecking labs today. Await results. Treat as needed. Call with any concerns.       Relevant Orders   TSH   Diet-controlled diabetes mellitus ([place]) - Primary    Stable with A1c of 6.7. Continue diet and exercise. Will recheck 3 months. Call with any concerns. Continue to monitor.       Relevant Orders   Comprehensive metabolic panel   CBC  with Differential/Platelet   Bayer DCA Hb A1c Waived (Completed)   Microalbumin, Urine Waived (Completed)   Lipid Panel w/o Chol/HDL Ratio   TSH   Urinalysis, Routine w reflex microscopic (Completed)     Genitourinary   Benign hypertensive renal disease    Will work on [employer]. Continue to monitor. Call with any concerns. Continue to monitor.       Relevant Orders   Comprehensive metabolic panel   Microalbumin, Urine Waived (Completed)   TSH   Urinalysis, Routine w reflex microscopic (Completed)     Follow up plan:  Return in about 3 months (around 08/11/2021).

## 2021-05-11 NOTE — Assessment & Plan Note (Signed)
Will work on [employer]. Continue to monitor. Call with any concerns. Continue to monitor.

## 2021-05-12 DIAGNOSIS — M25572 Pain in left ankle and joints of left foot: Secondary | ICD-10-CM | POA: Diagnosis not present

## 2021-05-12 LAB — COMPREHENSIVE METABOLIC PANEL
ALT: 24 IU/L (ref 0–32)
AST: 27 IU/L (ref 0–40)
Albumin/Globulin Ratio: 1.8 (ref 1.2–2.2)
Albumin: 4.6 g/dL (ref 3.8–4.9)
Alkaline Phosphatase: 95 IU/L (ref 44–121)
BUN/Creatinine Ratio: 20 (ref 9–23)
BUN: 17 mg/dL (ref 6–24)
Bilirubin Total: <0.2 mg/dL (ref 0.0–1.2)
CO2: 26 mmol/L (ref 20–29)
Calcium: 9.9 mg/dL (ref 8.7–10.2)
Chloride: 102 mmol/L (ref 96–106)
Creatinine, Ser: 0.83 mg/dL (ref 0.57–1.00)
Globulin, Total: 2.6 g/dL (ref 1.5–4.5)
Glucose: 124 mg/dL — ABNORMAL HIGH (ref 70–99)
Potassium: 4.4 mmol/L (ref 3.5–5.2)
Sodium: 142 mmol/L (ref 134–144)
Total Protein: 7.2 g/dL (ref 6.0–8.5)
eGFR: 85 mL/min/{1.73_m2} (ref >59)

## 2021-05-12 LAB — CBC WITH DIFFERENTIAL/PLATELET
Basophils Absolute: 0.1 10*3/uL (ref 0.0–0.2)
Basos: 1 %
EOS (ABSOLUTE): 0.4 10*3/uL (ref 0.0–0.4)
Eos: 4 %
Hematocrit: 44.7 % (ref 34.0–46.6)
Hemoglobin: 14.2 g/dL (ref 11.1–15.9)
Immature Grans (Abs): 0 10*3/uL (ref 0.0–0.1)
Immature Granulocytes: 0 %
Lymphocytes Absolute: 2.6 10*3/uL (ref 0.7–3.1)
Lymphs: 28 %
MCH: 27 pg (ref 26.6–33.0)
MCHC: 31.8 g/dL (ref 31.5–35.7)
MCV: 85 fL (ref 79–97)
Monocytes Absolute: 0.5 10*3/uL (ref 0.1–0.9)
Monocytes: 5 %
Neutrophils Absolute: 5.6 10*3/uL (ref 1.4–7.0)
Neutrophils: 62 %
Platelets: 271 10*3/uL (ref 150–450)
RBC: 5.25 x10E6/uL (ref 3.77–5.28)
RDW: 13.8 % (ref 11.7–15.4)
WBC: 9.1 10*3/uL (ref 3.4–10.8)

## 2021-05-12 LAB — LIPID PANEL W/O CHOL/HDL RATIO
Cholesterol, Total: 229 mg/dL — ABNORMAL HIGH (ref 100–199)
HDL: 55 mg/dL (ref >39)
LDL Chol Calc (NIH): 147 mg/dL — ABNORMAL HIGH (ref 0–99)
Triglycerides: 151 mg/dL — ABNORMAL HIGH (ref 0–149)
VLDL Cholesterol Cal: 27 mg/dL (ref 5–40)

## 2021-05-12 LAB — TSH: TSH: 1.66 u[IU]/mL (ref 0.450–4.500)

## 2021-08-11 ENCOUNTER — Ambulatory Visit (INDEPENDENT_AMBULATORY_CARE_PROVIDER_SITE_OTHER): Payer: BC Managed Care – PPO | Admitting: Family Medicine

## 2021-08-11 ENCOUNTER — Encounter: Payer: Self-pay | Admitting: Family Medicine

## 2021-08-11 ENCOUNTER — Other Ambulatory Visit: Payer: Self-pay

## 2021-08-11 VITALS — BP 158/81 | HR 61 | Ht 67.0 in | Wt 256.0 lb

## 2021-08-11 DIAGNOSIS — E119 Type 2 diabetes mellitus without complications: Secondary | ICD-10-CM

## 2021-08-11 DIAGNOSIS — I129 Hypertensive chronic kidney disease with stage 1 through stage 4 chronic kidney disease, or unspecified chronic kidney disease: Secondary | ICD-10-CM | POA: Diagnosis not present

## 2021-08-11 LAB — BAYER DCA HB A1C WAIVED: HB A1C (BAYER DCA - WAIVED): 6.8 % — ABNORMAL HIGH (ref 4.8–5.6)

## 2021-08-11 MED ORDER — LOSARTAN POTASSIUM 25 MG PO TABS: 12.5000 mg | ORAL_TABLET | Freq: Every day | ORAL | 2 refills | Status: DC

## 2021-08-11 NOTE — Assessment & Plan Note (Signed)
Will get her started on losartan and recheck 1 month. Call with any concerns. Continue to monitor.

## 2021-08-11 NOTE — Assessment & Plan Note (Addendum)
Stable with A1c of 6.8. Continue diet and exercise. Call with any concerns.

## 2021-08-11 NOTE — Progress Notes (Signed)
BP (!) 158/81    Pulse 61    Ht 5' 7"  (1.702 m)    Wt 256 lb (116.1 kg)    SpO2 100%    BMI 40.10 kg/m    Subjective:    Patient ID: Teresa Peters, female    DOB: 05-04-1971, 51 y.o.   MRN: [id number]  HPI: Teresa Peters is a 51 y.o. female  Chief Complaint  Patient presents with   Diabetes   HYPERTENSION Hypertension status: uncontrolled  Satisfied with current treatment? no Duration of hypertension: chronic BP monitoring frequency:  not checking BP medication side effects:  no Medication compliance: not on anything Previous BP meds: lisinpril Aspirin: no Recurrent headaches: no Visual changes: no Palpitations: no Dyspnea: no Chest pain: no Lower extremity edema: no Dizzy/lightheaded: no  DIABETES Hypoglycemic episodes:no Polydipsia/polyuria: no Visual disturbance: no Chest pain: no Paresthesias: no Glucose Monitoring: no  Accucheck frequency: Not Checking Taking Insulin?: no Blood Pressure Monitoring: not checking Retinal Examination: Not up to Date Foot Exam: Up to Date Diabetic Education: Completed Pneumovax:  Refused Influenza: Refused Aspirin: no  Relevant past medical, surgical, family and social history reviewed and updated as indicated. Interim medical history since our last visit reviewed. Allergies and medications reviewed and updated.  Review of Systems  Constitutional: Negative.   Respiratory: Negative.    Cardiovascular: Negative.   Gastrointestinal:  Positive for abdominal pain. Negative for abdominal distention, anal bleeding, blood in stool, constipation, diarrhea, nausea, rectal pain and vomiting.  Musculoskeletal: Negative.   Neurological: Negative.   Psychiatric/Behavioral: Negative.     Per HPI unless specifically indicated above     Objective:    BP (!) 158/81    Pulse 61    Ht 5' 7"  (1.702 m)    Wt 256 lb (116.1 kg)    SpO2 100%    BMI 40.10 kg/m   Wt Readings from Last 3 Encounters:  08/11/21 256 lb (116.1 kg)   05/11/21 259 lb 12.8 oz (117.8 kg)  04/14/21 255 lb (115.7 kg)    Physical Exam Vitals and nursing note reviewed.  Constitutional:      General: She is not in acute distress.    Appearance: Normal appearance. She is not ill-appearing, toxic-appearing or diaphoretic.  HENT:     Head: Normocephalic and atraumatic.     Right Ear: External ear normal.     Left Ear: External ear normal.     Nose: Nose normal.     Mouth/Throat:     Mouth: Mucous membranes are moist.     Pharynx: Oropharynx is clear.  Eyes:     General: No scleral icterus.       Right eye: No discharge.        Left eye: No discharge.     Extraocular Movements: Extraocular movements intact.     Conjunctiva/sclera: Conjunctivae normal.     Pupils: Pupils are equal, round, and reactive to light.  Cardiovascular:     Rate and Rhythm: Normal rate and regular rhythm.     Pulses: Normal pulses.     Heart sounds: Normal heart sounds. No murmur heard.   No friction rub. No gallop.  Pulmonary:     Effort: Pulmonary effort is normal. No respiratory distress.     Breath sounds: Normal breath sounds. No stridor. No wheezing, rhonchi or rales.  Chest:     Chest wall: No tenderness.  Musculoskeletal:        General: Normal range of motion.  Cervical back: Normal range of motion and neck supple.  Skin:    General: Skin is warm and dry.     Capillary Refill: Capillary refill takes less than 2 seconds.     Coloration: Skin is not jaundiced or pale.     Findings: No bruising, erythema, lesion or rash.  Neurological:     General: No focal deficit present.     Mental Status: She is alert and oriented to person, place, and time. Mental status is at baseline.  Psychiatric:        Mood and Affect: Mood normal.        Behavior: Behavior normal.        Thought Content: Thought content normal.        Judgment: Judgment normal.    Results for orders placed or performed in visit on 05/11/21  Comprehensive metabolic panel   Result Value Ref Range   Glucose 124 (H) 70 - 99 mg/dL   BUN 17 6 - 24 mg/dL   Creatinine, Ser 0.83 0.57 - 1.00 mg/dL   eGFR 85 >59 mL/min/1.73   BUN/Creatinine Ratio 20 9 - 23   Sodium 142 134 - 144 mmol/L   Potassium 4.4 3.5 - 5.2 mmol/L   Chloride 102 96 - 106 mmol/L   CO2 26 20 - 29 mmol/L   Calcium 9.9 8.7 - 10.2 mg/dL   Total Protein 7.2 6.0 - 8.5 g/dL   Albumin 4.6 3.8 - 4.9 g/dL   Globulin, Total 2.6 1.5 - 4.5 g/dL   Albumin/Globulin Ratio 1.8 1.2 - 2.2   Bilirubin Total <0.2 0.0 - 1.2 mg/dL   Alkaline Phosphatase 95 44 - 121 IU/L   AST 27 0 - 40 IU/L   ALT 24 0 - 32 IU/L  CBC with Differential/Platelet  Result Value Ref Range   WBC 9.1 3.4 - 10.8 x10E3/uL   RBC 5.25 3.77 - 5.28 x10E6/uL   Hemoglobin 14.2 11.1 - 15.9 g/dL   Hematocrit 44.7 34.0 - 46.6 %   MCV 85 79 - 97 fL   MCH 27.0 26.6 - 33.0 pg   MCHC 31.8 31.5 - 35.7 g/dL   RDW 13.8 11.7 - 15.4 %   Platelets 271 150 - 450 x10E3/uL   Neutrophils 62 Not Estab. %   Lymphs 28 Not Estab. %   Monocytes 5 Not Estab. %   Eos 4 Not Estab. %   Basos 1 Not Estab. %   Neutrophils Absolute 5.6 1.4 - 7.0 x10E3/uL   Lymphocytes Absolute 2.6 0.7 - 3.1 x10E3/uL   Monocytes Absolute 0.5 0.1 - 0.9 x10E3/uL   EOS (ABSOLUTE) 0.4 0.0 - 0.4 x10E3/uL   Basophils Absolute 0.1 0.0 - 0.2 x10E3/uL   Immature Granulocytes 0 Not Estab. %   Immature Grans (Abs) 0.0 0.0 - 0.1 x10E3/uL  Bayer DCA Hb A1c Waived  Result Value Ref Range   HB A1C (BAYER DCA - WAIVED) 6.7 (H) 4.8 - 5.6 %  Microalbumin, Urine Waived  Result Value Ref Range   Microalb, Ur Waived 10 0 - 19 mg/L   Creatinine, Urine Waived 50 10 - 300 mg/dL   Microalb/Creat Ratio <30 <30 mg/g  Lipid Panel w/o Chol/HDL Ratio  Result Value Ref Range   Cholesterol, Total 229 (H) 100 - 199 mg/dL   Triglycerides 151 (H) 0 - 149 mg/dL   HDL 55 >39 mg/dL   VLDL Cholesterol Cal 27 5 - 40 mg/dL   LDL Chol Calc (NIH) 147 (H) 0 -  99 mg/dL  TSH  Result Value Ref Range   TSH 1.660  0.450 - 4.500 uIU/mL  Urinalysis, Routine w reflex microscopic  Result Value Ref Range   Specific Gravity, UA 1.015 1.005 - 1.030   pH, UA 5.5 5.0 - 7.5   Color, UA Yellow Yellow   Appearance Ur Clear Clear   Leukocytes,UA Negative Negative   Protein,UA Negative Negative/Trace   Glucose, UA Negative Negative   Ketones, UA Negative Negative   RBC, UA Negative Negative   Bilirubin, UA Negative Negative   Urobilinogen, Ur 0.2 0.2 - 1.0 mg/dL   Nitrite, UA Negative Negative      Assessment & Plan:   Problem List Items Addressed This Visit       Endocrine   Diet-controlled diabetes mellitus ([place]) - Primary    Stable with A1c of 6.8. Continue diet and exercise. Call with any concerns.       Relevant Medications   losartan (COZAAR) 25 MG tablet   Other Relevant Orders   Bayer DCA Hb A1c Waived     Genitourinary   Benign hypertensive renal disease    Will get her started on losartan and recheck 1 month. Call with any concerns. Continue to monitor.        Follow up plan: Return in about 4 weeks (around 09/08/2021).

## 2021-09-07 ENCOUNTER — Ambulatory Visit: Payer: Self-pay | Admitting: *Deleted

## 2021-09-07 NOTE — Telephone Encounter (Signed)
I returned pt's call.   Seeking advice regarding losartan 25 mg.  She was prescribed this.  Took it once and hasn't taken it again.   She wants to combat her medical issues without using this prescription.   ? ?I left a voicemail to call back. ? ? ?

## 2021-09-07 NOTE — Telephone Encounter (Signed)
?  Pt. Stopped losartan and is doing lifestyle changes for her BP. Wants to know from PCP when should she follow up with her. Please advise pt. ? ? ?Answer Assessment - Initial Assessment Questions ?1. NAME of MEDICATION: "What medicine are you calling about?" ?    Losartan ?2. QUESTION: "What is your question?" (e.g., double dose of medicine, side effect) ?    Pt. Is stopping medication ?3. PRESCRIBING HCP: "Who prescribed it?" Reason: if prescribed by specialist, call should be referred to that group. ?    Dr. Laural Benes ?4. SYMPTOMS: "Do you have any symptoms?" ?    N/a ?5. SEVERITY: If symptoms are present, ask "Are they mild, moderate or severe?" ?    N/a ?6. PREGNANCY:  "Is there any chance that you are pregnant?" "When was your last menstrual period?" ?    No ? ?Protocols used: Medication Question Call-A-AH ? ?

## 2021-09-08 ENCOUNTER — Ambulatory Visit: Payer: BC Managed Care – PPO | Admitting: Family Medicine

## 2021-09-08 NOTE — Telephone Encounter (Signed)
Lmom asking pt to call back to schedule next available appt as pt is due for fu today. ?

## 2021-09-08 NOTE — Telephone Encounter (Signed)
Should still follow up about a month after I saw her ?

## 2021-09-09 NOTE — Telephone Encounter (Signed)
Pt scheduled 4/18

## 2021-10-04 ENCOUNTER — Encounter: Payer: Self-pay | Admitting: Internal Medicine

## 2021-10-04 ENCOUNTER — Ambulatory Visit: Payer: Self-pay | Admitting: *Deleted

## 2021-10-04 ENCOUNTER — Ambulatory Visit (INDEPENDENT_AMBULATORY_CARE_PROVIDER_SITE_OTHER): Payer: BC Managed Care – PPO | Admitting: Internal Medicine

## 2021-10-04 VITALS — BP 142/86 | HR 74 | Temp 98.3°F | Ht 67.01 in | Wt 248.2 lb

## 2021-10-04 DIAGNOSIS — R309 Painful micturition, unspecified: Secondary | ICD-10-CM | POA: Insufficient documentation

## 2021-10-04 LAB — URINALYSIS, ROUTINE W REFLEX MICROSCOPIC
Bilirubin, UA: NEGATIVE
Glucose, UA: NEGATIVE
Ketones, UA: NEGATIVE
Nitrite, UA: NEGATIVE
Protein,UA: NEGATIVE
Specific Gravity, UA: <1.005 — ABNORMAL LOW (ref 1.005–1.030)
Urobilinogen, Ur: 0.2 mg/dL (ref 0.2–1.0)
pH, UA: 5.5 (ref 5.0–7.5)

## 2021-10-04 LAB — MICROSCOPIC EXAMINATION: Bacteria, UA: NONE SEEN

## 2021-10-04 MED ORDER — SULFAMETHOXAZOLE-TRIMETHOPRIM 800-160 MG PO TABS: 1.0000 | ORAL_TABLET | Freq: Two times a day (BID) | ORAL | 0 refills | Status: AC

## 2021-10-04 NOTE — Telephone Encounter (Signed)
Per agent: ?"The patient would like to speak with a nurse about their urinary discomfort  ? ?The patient has experienced urinary discomfort since Friday 09/30/21 and believes that they also previously experienced a stomach virus  ? ?The patient would like to be prescribed something for their discomfort ." ? ?Please contact further ? ? ? ?Chief Complaint: Dysuria ?Symptoms: Painful urination, frequency ?Frequency: Saturday, worsening Sunday, mild back pain ?Pertinent Negatives: Patient denies blood in urine, fever ?Disposition: [] ED /[] Urgent Care (no appt availability in office) / [x] Appointment(In office/virtual)/ []  [facility]/ [] Home Care/ [] Refused Recommended Disposition /[] [facility]/ []  Follow-up with PCP ?Additional Notes: Care advise given, pt verbalizes understanding. ? ?Pt also wants Dr. Wynetta Emery to know after stressful episode yesterday she felt BP was elevated, did not check, but did take 1/2 Losartan.   ? ?Reason for Disposition ? Urinating more frequently than usual (i.e., frequency) ? ?Answer Assessment - Initial Assessment Questions ?1. SYMPTOM: "What's the main symptom you're concerned about?" (e.g., frequency, incontinence) ?    Dysuria ?2. ONSET: "When did the  start?" ?    Friday mild UTI symptoms. Sat "Stomach bug" vomiting. Sunday night worsening UTI symptoms ?3. PAIN: "Is there any pain?" If Yes, ask: "How bad is it?" (Scale: 1-10; mild, moderate, severe) ?    Yes, last night 7-8/10  this AM 3-4/10 ?4. CAUSE: "What do you think is causing the symptoms?" ?    UTI ?5. OTHER SYMPTOMS: "Do you have any other symptoms?" (e.g., fever, flank pain, blood in urine, pain with urination) ?    Urgency and frequency. Weekend maybe LGT ? ?Protocols used: Urinary Symptoms-A-AH ? ?

## 2021-10-04 NOTE — Progress Notes (Signed)
? ?BP (!) 142/86   Pulse 74   Temp 98.3 ?F (36.8 ?C) (Oral)   Ht 5' 7.01" (1.702 m)   Wt 248 lb 3.2 oz (112.6 kg)   SpO2 98%   BMI 38.86 kg/m?   ? ?Subjective:  ? ? Patient ID: Teresa Peters, female    DOB: 1970-08-18, 51 y.o.   MRN: [id number] ? ?Chief Complaint  ?Patient presents with  ? painful urination  ?  Started on Saturday  ? ? ?HPI: ?Teresa Peters is a 51 y.o. female ? ?Urinary Frequency  ?This is a new problem. The problem has been waxing and waning. The quality of the pain is described as burning. Associated symptoms include frequency, nausea and vomiting. Pertinent negatives include no chills, discharge, flank pain, hematuria, hesitancy, possible pregnancy, sweats or urgency. Associated symptoms comments: Burning when she pees. Has been increasing water intake. Symptoms started Friday - Saturday night had a stomach bug - in middle of the night rolled over and had nausea threw up about 4 times on Saturday  ?The day before she thought she had a UTI and had a lot of lemon water and towards the end of the heave she could taste lemon juice. ?.  ? ?Chief Complaint  ?Patient presents with  ? painful urination  ?  Started on Saturday  ? ? ?Relevant past medical, surgical, family and social history reviewed and updated as indicated. Interim medical history since our last visit reviewed. ?Allergies and medications reviewed and updated. ? ?Review of Systems  ?Constitutional:  Negative for chills.  ?Gastrointestinal:  Positive for nausea and vomiting.  ?Genitourinary:  Positive for frequency. Negative for flank pain, hematuria, hesitancy and urgency.  ? ?Per HPI unless specifically indicated above ? ?   ?Objective:  ?  ?BP (!) 142/86   Pulse 74   Temp 98.3 ?F (36.8 ?C) (Oral)   Ht 5' 7.01" (1.702 m)   Wt 248 lb 3.2 oz (112.6 kg)   SpO2 98%   BMI 38.86 kg/m?   ?Wt Readings from Last 3 Encounters:  ?10/04/21 248 lb 3.2 oz (112.6 kg)  ?08/11/21 256 lb (116.1 kg)  ?05/11/21 259 lb 12.8 oz (117.8  kg)  ?  ?Physical Exam ?Vitals and nursing note reviewed.  ?Constitutional:   ?   General: She is not in acute distress. ?   Appearance: Normal appearance. She is not ill-appearing or diaphoretic.  ?Eyes:  ?   Conjunctiva/sclera: Conjunctivae normal.  ?Pulmonary:  ?   Breath sounds: No rhonchi.  ?Abdominal:  ?   General: Bowel sounds are normal.  ?   Palpations: Abdomen is soft. There is no mass.  ?   Tenderness: There is no abdominal tenderness. There is no guarding.  ?   Hernia: No hernia is present.  ?Skin: ?   General: Skin is warm and dry.  ?   Coloration: Skin is not jaundiced.  ?   Findings: No erythema.  ?Neurological:  ?   Mental Status: She is alert.  ? ? ?Results for orders placed or performed in visit on 08/11/21  ?Bayer DCA Hb A1c Waived  ?Result Value Ref Range  ? HB A1C (BAYER DCA - WAIVED) 6.8 (H) 4.8 - 5.6 %  ? ?   ? ? ?Current Outpatient Medications:  ?  ARMOUR THYROID 90 MG tablet, TAKE 1 TABLET DAILY, Disp: 90 tablet, Rfl: 3 ?  B Complex Vitamins (VITAMIN B COMPLEX PO), Take by mouth daily., Disp: , Rfl:  ?  losartan (COZAAR) 25 MG tablet, Take 0.5 tablets (12.5 mg total) by mouth daily., Disp: 15 tablet, Rfl: 2 ?  Menaquinone-7 (VITAMIN K2 PO), Take by mouth daily., Disp: , Rfl:  ?  sulfamethoxazole-trimethoprim (BACTRIM DS) 800-160 MG tablet, Take 1 tablet by mouth 2 (two) times daily for 5 days., Disp: 10 tablet, Rfl: 0 ?  VITAMIN D PO, Take by mouth daily., Disp: , Rfl:   ? ? ?Assessment & Plan:  ?Uti  ?check UA.  pt is currently symptomatic for an Urinary tract infection(abd pain, burning etc), will cover with emperic abx, see med module for details.  encouraged to increase water/fluid intake.Signs and symptoms of emergency were discussed with the patient. The risks, benefits and side effects of treatment were discussed with the patient. The patient verbalized an understanding of plan, and was told to call the clinic/go to the ED if symptoms worsen at any point of time. ? ?Problem List Items  Addressed This Visit   ? ?  ? Other  ? Painful urination - Primary  ? Relevant Orders  ? Urine Culture  ? Urinalysis, Routine w reflex microscopic  ?  ? ?Orders Placed This Encounter  ?Procedures  ? Urine Culture  ? Urinalysis, Routine w reflex microscopic  ?  ? ?Meds ordered this encounter  ?Medications  ? sulfamethoxazole-trimethoprim (BACTRIM DS) 800-160 MG tablet  ?  Sig: Take 1 tablet by mouth 2 (two) times daily for 5 days.  ?  Dispense:  10 tablet  ?  Refill:  0  ?  ? ?Follow up plan: ?No follow-ups on file. ? ? ?

## 2021-10-07 LAB — URINE CULTURE

## 2021-10-10 ENCOUNTER — Other Ambulatory Visit: Payer: Self-pay | Admitting: Family Medicine

## 2021-10-10 NOTE — Telephone Encounter (Signed)
Requested Prescriptions  ?Pending Prescriptions Disp Refills  ?? ARMOUR THYROID 90 MG tablet [Pharmacy Med Name: ARMOUR THYROID TABS 1&1/2GR 90MG ] 90 tablet 2  ?  Sig: TAKE 1 TABLET DAILY  ?  ? Endocrinology:  Hypothyroid Agents Passed - 10/10/2021  1:15 AM  ?  ?  Passed - TSH in normal range and within 360 days  ?  TSH  ?Date Value Ref Range Status  ?05/11/2021 1.660 0.450 - 4.500 uIU/mL Final  ?   ?  ?  Passed - Valid encounter within last 12 months  ?  Recent Outpatient Visits   ?      ? 6 days ago Painful urination  ? [facility] [place], Avanti, MD  ? 2 months ago Diet-controlled diabetes mellitus (HCC)  ? [facility] [place], [place] P, DO  ? 5 months ago Diet-controlled diabetes mellitus (HCC)  ? [facility] [place], Megan P, DO  ? 5 months ago Screening for cervical cancer  ? [facility] [place], [place], DO  ? 8 months ago Diet-controlled diabetes mellitus (HCC)  ? [facility] [place], [place] P, DO  ?  ?  ?Future Appointments   ?        ? In 1 week [place], Laural Benes, DO [facility]  ?  ? ?  ?  ?  ? ?

## 2021-10-18 ENCOUNTER — Ambulatory Visit (INDEPENDENT_AMBULATORY_CARE_PROVIDER_SITE_OTHER): Payer: BC Managed Care – PPO | Admitting: Family Medicine

## 2021-10-18 ENCOUNTER — Encounter: Payer: Self-pay | Admitting: Family Medicine

## 2021-10-18 VITALS — BP 118/75 | HR 74 | Temp 98.3°F | Wt 251.0 lb

## 2021-10-18 DIAGNOSIS — I129 Hypertensive chronic kidney disease with stage 1 through stage 4 chronic kidney disease, or unspecified chronic kidney disease: Secondary | ICD-10-CM | POA: Diagnosis not present

## 2021-10-18 DIAGNOSIS — E039 Hypothyroidism, unspecified: Secondary | ICD-10-CM

## 2021-10-18 DIAGNOSIS — K5900 Constipation, unspecified: Secondary | ICD-10-CM | POA: Diagnosis not present

## 2021-10-18 MED ORDER — LOSARTAN POTASSIUM 25 MG PO TABS: 12.5000 mg | ORAL_TABLET | Freq: Every day | ORAL | 1 refills | Status: AC

## 2021-10-18 NOTE — Progress Notes (Signed)
? ?BP 118/75   Pulse 74   Temp 98.3 ?F (36.8 ?C)   Wt 251 lb (113.9 kg)   SpO2 97%   BMI 39.30 kg/m?   ? ?Subjective:  ? ? Patient ID: Teresa Peters, female    DOB: February 04, 1971, 51 y.o.   MRN: [id number] ? ?HPI: ?Teresa Peters is a 51 y.o. female ? ?Chief Complaint  ?Patient presents with  ? Hypertension  ?  Patient states she missed her last follow up appointment due to not starting losartan when prescribed. Patient states she started taking losartan 10/03/21.  ? Constipation  ?  Patient states she is having trouble with bowel movements, has changed her diet but still does not see any relief.   ? ?HYPERTENSION ?Hypertension status: controlled  ?Satisfied with current treatment? yes ?Duration of hypertension: chronic ?BP monitoring frequency:  not checking ?BP medication side effects:  no ?Medication compliance: excellent compliance ?Previous BP meds:losartan ?Aspirin: no ?Recurrent headaches: no ?Visual changes: no ?Palpitations: no ?Dyspnea: no ?Chest pain: no ?Lower extremity edema: no ?Dizzy/lightheaded: no ? ?Has been having more issues with constipation. Has been taking magnesium and increasing fluids and greens but without much benefit. Has some discomfort in the LUQ of her belly. She also notes that she has been more tired. No other concerns or complaints at this time.  ? ? ?Relevant past medical, surgical, family and social history reviewed and updated as indicated. Interim medical history since our last visit reviewed. ?Allergies and medications reviewed and updated. ? ?Review of Systems  ?Constitutional: Negative.   ?Respiratory: Negative.    ?Cardiovascular: Negative.   ?Gastrointestinal:  Positive for constipation. Negative for abdominal distention, abdominal pain, anal bleeding, blood in stool, diarrhea, nausea, rectal pain and vomiting.  ?Musculoskeletal: Negative.   ?Psychiatric/Behavioral: Negative.    ? ?Per HPI unless specifically indicated above ? ?   ?Objective:  ?  ?BP 118/75    Pulse 74   Temp 98.3 ?F (36.8 ?C)   Wt 251 lb (113.9 kg)   SpO2 97%   BMI 39.30 kg/m?   ?Wt Readings from Last 3 Encounters:  ?10/18/21 251 lb (113.9 kg)  ?10/04/21 248 lb 3.2 oz (112.6 kg)  ?08/11/21 256 lb (116.1 kg)  ?  ?Physical Exam ?Vitals and nursing note reviewed.  ?Constitutional:   ?   General: She is not in acute distress. ?   Appearance: Normal appearance. She is obese. She is not ill-appearing, toxic-appearing or diaphoretic.  ?HENT:  ?   Head: Normocephalic and atraumatic.  ?   Right Ear: External ear normal.  ?   Left Ear: External ear normal.  ?   Nose: Nose normal.  ?   Mouth/Throat:  ?   Mouth: Mucous membranes are moist.  ?   Pharynx: Oropharynx is clear.  ?Eyes:  ?   General: No scleral icterus.    ?   Right eye: No discharge.     ?   Left eye: No discharge.  ?   Extraocular Movements: Extraocular movements intact.  ?   Conjunctiva/sclera: Conjunctivae normal.  ?   Pupils: Pupils are equal, round, and reactive to light.  ?Cardiovascular:  ?   Rate and Rhythm: Normal rate and regular rhythm.  ?   Pulses: Normal pulses.  ?   Heart sounds: Normal heart sounds. No murmur heard. ?  No friction rub. No gallop.  ?Pulmonary:  ?   Effort: Pulmonary effort is normal. No respiratory distress.  ?   Breath sounds:  Normal breath sounds. No stridor. No wheezing, rhonchi or rales.  ?Chest:  ?   Chest wall: No tenderness.  ?Abdominal:  ?   General: Abdomen is flat. Bowel sounds are normal. There is no distension.  ?   Palpations: Abdomen is soft. There is no mass.  ?   Tenderness: There is no abdominal tenderness. There is no right CVA tenderness, left CVA tenderness, guarding or rebound.  ?   Hernia: No hernia is present.  ?Musculoskeletal:     ?   General: Normal range of motion.  ?   Cervical back: Normal range of motion and neck supple.  ?Skin: ?   General: Skin is warm and dry.  ?   Capillary Refill: Capillary refill takes less than 2 seconds.  ?   Coloration: Skin is not jaundiced or pale.  ?    Findings: No bruising, erythema, lesion or rash.  ?Neurological:  ?   General: No focal deficit present.  ?   Mental Status: She is alert and oriented to person, place, and time. Mental status is at baseline.  ?Psychiatric:     ?   Mood and Affect: Mood normal.     ?   Behavior: Behavior normal.     ?   Thought Content: Thought content normal.     ?   Judgment: Judgment normal.  ? ? ?Results for orders placed or performed in visit on 10/04/21  ?Urine Culture  ? Specimen: Urine  ? UR  ?Result Value Ref Range  ? Urine Culture, Routine Final report (A)   ? Organism ID, Bacteria Escherichia coli (A)   ? Antimicrobial Susceptibility Comment   ?Microscopic Examination  ? Urine  ?Result Value Ref Range  ? WBC, UA 0-5 0 - 5 /hpf  ? RBC 0-2 0 - 2 /hpf  ? Epithelial Cells (non renal) 0-10 0 - 10 /hpf  ? Bacteria, UA None seen None seen/Few  ?Urinalysis, Routine w reflex microscopic  ?Result Value Ref Range  ? Specific Gravity, UA <1.005 (L) 1.005 - 1.030  ? pH, UA 5.5 5.0 - 7.5  ? Color, UA Yellow Yellow  ? Appearance Ur Clear Clear  ? Leukocytes,UA 1+ (A) Negative  ? Protein,UA Negative Negative/Trace  ? Glucose, UA Negative Negative  ? Ketones, UA Negative Negative  ? RBC, UA Trace (A) Negative  ? Bilirubin, UA Negative Negative  ? Urobilinogen, Ur 0.2 0.2 - 1.0 mg/dL  ? Nitrite, UA Negative Negative  ? Microscopic Examination See below:   ? ?   ?Assessment & Plan:  ? ?Problem List Items Addressed This Visit   ? ?  ? Endocrine  ? Acquired hypothyroidism  ?  Normal last check but more tired and constipated. Will check labs. Await results.  ? ?  ?  ? Relevant Orders  ? TSH  ?  ? Genitourinary  ? Benign hypertensive renal disease - Primary  ?  Under good control on current regimen. Continue current regimen. Continue to monitor. Call with any concerns. Labs drawn today. ? ? ?  ?  ? Relevant Orders  ? Basic metabolic panel  ? ?Other Visit Diagnoses   ? ? Constipation, unspecified constipation type      ? Will try miralax and  dietary changes. She will call with any concerns or if not getting better. Checking thyroid today.  ? ?  ?  ? ?Follow up plan: ?Return in about 4 weeks (around 11/15/2021). ? ? ? ? ? ?

## 2021-10-18 NOTE — Assessment & Plan Note (Signed)
Normal last check but more tired and constipated. Will check labs. Await results.  ?

## 2021-10-18 NOTE — Assessment & Plan Note (Signed)
Under good control on current regimen. Continue current regimen. Continue to monitor. Call with any concerns. Labs drawn today.  

## 2021-10-19 LAB — BASIC METABOLIC PANEL
BUN/Creatinine Ratio: 15 (ref 9–23)
BUN: 13 mg/dL (ref 6–24)
CO2: 22 mmol/L (ref 20–29)
Calcium: 9.6 mg/dL (ref 8.7–10.2)
Chloride: 102 mmol/L (ref 96–106)
Creatinine, Ser: 0.86 mg/dL (ref 0.57–1.00)
Glucose: 124 mg/dL — ABNORMAL HIGH (ref 70–99)
Potassium: 4.5 mmol/L (ref 3.5–5.2)
Sodium: 140 mmol/L (ref 134–144)
eGFR: 82 mL/min/{1.73_m2} (ref >59)

## 2021-10-19 LAB — TSH: TSH: 0.954 u[IU]/mL (ref 0.450–4.500)

## 2021-11-05 ENCOUNTER — Encounter: Payer: Self-pay | Admitting: Radiology

## 2021-11-05 ENCOUNTER — Emergency Department
Admission: EM | Admit: 2021-11-05 | Discharge: 2021-11-06 | Disposition: A | Discharge status: Home / Self Care | Payer: BC Managed Care – PPO | Attending: Emergency Medicine | Admitting: Emergency Medicine

## 2021-11-05 ENCOUNTER — Other Ambulatory Visit: Payer: Self-pay

## 2021-11-05 ENCOUNTER — Emergency Department: Payer: BC Managed Care – PPO

## 2021-11-05 DIAGNOSIS — E119 Type 2 diabetes mellitus without complications: Secondary | ICD-10-CM | POA: Insufficient documentation

## 2021-11-05 DIAGNOSIS — R0789 Other chest pain: Secondary | ICD-10-CM | POA: Diagnosis not present

## 2021-11-05 DIAGNOSIS — E039 Hypothyroidism, unspecified: Secondary | ICD-10-CM | POA: Diagnosis not present

## 2021-11-05 DIAGNOSIS — Z79899 Other long term (current) drug therapy: Secondary | ICD-10-CM | POA: Insufficient documentation

## 2021-11-05 DIAGNOSIS — R1010 Upper abdominal pain, unspecified: Secondary | ICD-10-CM | POA: Diagnosis not present

## 2021-11-05 DIAGNOSIS — R079 Chest pain, unspecified: Secondary | ICD-10-CM | POA: Insufficient documentation

## 2021-11-05 DIAGNOSIS — D72829 Elevated white blood cell count, unspecified: Secondary | ICD-10-CM | POA: Insufficient documentation

## 2021-11-05 LAB — CBC
HCT: 41.3 % (ref 36.0–46.0)
Hemoglobin: 13.2 g/dL (ref 12.0–15.0)
MCH: 26.7 pg (ref 26.0–34.0)
MCHC: 32 g/dL (ref 30.0–36.0)
MCV: 83.6 fL (ref 80.0–100.0)
Platelets: 256 10*3/uL (ref 150–400)
RBC: 4.94 MIL/uL (ref 3.87–5.11)
RDW: 14.4 % (ref 11.5–15.5)
WBC: 14.1 10*3/uL — ABNORMAL HIGH (ref 4.0–10.5)
nRBC: 0 % (ref 0.0–0.2)

## 2021-11-05 LAB — BASIC METABOLIC PANEL
Anion gap: 10 (ref 5–15)
BUN: 14 mg/dL (ref 6–20)
CO2: 26 mmol/L (ref 22–32)
Calcium: 9.2 mg/dL (ref 8.9–10.3)
Chloride: 104 mmol/L (ref 98–111)
Creatinine, Ser: 0.77 mg/dL (ref 0.44–1.00)
GFR, Estimated: >60 mL/min (ref >60)
Glucose, Bld: 110 mg/dL — ABNORMAL HIGH (ref 70–99)
Potassium: 3.8 mmol/L (ref 3.5–5.1)
Sodium: 140 mmol/L (ref 135–145)

## 2021-11-05 LAB — TROPONIN I (HIGH SENSITIVITY): Troponin I (High Sensitivity): 13 ng/L (ref <18)

## 2021-11-05 LAB — LIPASE, BLOOD: Lipase: 34 U/L (ref 11–51)

## 2021-11-05 MED ORDER — FAMOTIDINE IN NACL 20-0.9 MG/50ML-% IV SOLN
20.0000 mg | Freq: Once | INTRAVENOUS | Status: AC
  Administered 2021-11-06: 20 mg via INTRAVENOUS
  Filled 2021-11-05: qty 50

## 2021-11-05 NOTE — ED Triage Notes (Signed)
Pt states she had right shoulder pain last night and chest pressure. Hx of gallbladder issues. Central chest pressure all day. ?

## 2021-11-05 NOTE — ED Provider Notes (Signed)
? ?[facility] ?Provider Note ? ? ? Event Date/Time  ? First MD Initiated Contact with Patient 11/05/21 2322   ?  (approximate) ? ? ?History  ? ?Chest Pain ? ? ?HPI ? ?Teresa GunnerCarla Doris Peters is a 51 y.o. female who presents to the ED from home with a chief complaint of right-sided chest pain after she woke up this morning.  Reports pressure radiating to her right shoulder which has been constant and not affected by food.  Does have a history of gallbladder issues.  Feels like she cannot get a deep breath and when she does it does exacerbate her pain.  Denies recent fever, cough, shortness of breath, abdominal pain, nausea, vomiting or dizziness.  Denies recent travel, trauma or hormone use.  Does have a history of esophageal spasms. ?  ? ? ?Past Medical History  ? ?Past Medical History:  ?Diagnosis Date  ?? Anemia   ?? Cellulitis   ?? Diabetes mellitus without complication (HCC)   ?? Fatty liver   ?? Hypothyroidism   ?? Obesity   ?? Prediabetes   ? ? ? ?Active Problem List  ? ?Patient Active Problem List  ? Diagnosis Date Noted  ?? Painful urination 10/04/2021  ?? Post-COVID syndrome 04/14/2021  ?? Paraosmia 10/12/2020  ?? Benign hypertensive renal disease 12/24/2019  ?? Diet-controlled diabetes mellitus (HCC) 09/06/2019  ?? TMJ (temporomandibular joint disorder) 04/15/2019  ?? Gastric polyp   ?? Hemorrhoids without complication   ?? Acquired hypothyroidism 01/06/2019  ?? Change in voice 01/06/2019  ?? Biliary sludge determined by ultrasound 08/25/2018  ?? Obesity 08/15/2018  ?? Varicose veins of both legs with edema 08/15/2018  ?? Vitamin B 12 deficiency 08/15/2018  ?? Vitamin D deficiency 08/15/2018  ?? Mild anemia 06/10/2018  ? ? ? ?Past Surgical History  ? ?Past Surgical History:  ?Procedure Laterality Date  ?? ADENOIDECTOMY    ?? ADENOIDECTOMY    ?? COLONOSCOPY WITH PROPOFOL N/A 01/14/2019  ? Procedure: COLONOSCOPY WITH PROPOFOL;  Surgeon: Pasty Spillersahiliani, Varnita B, MD;  [facility];  Service: Endoscopy;  Laterality: N/A;  ?? ESOPHAGOGASTRODUODENOSCOPY  2012  ? polyps (pt states they were not removed)  ?? ESOPHAGOGASTRODUODENOSCOPY (EGD) WITH PROPOFOL N/A 01/14/2019  ? Procedure: ESOPHAGOGASTRODUODENOSCOPY (EGD) WITH BIOPSY;  Surgeon: Pasty Spillersahiliani, Varnita B, MD;  [facility];  Service: Endoscopy;  Laterality: N/A;  ?? FOOT SURGERY    ?? POLYPECTOMY N/A 01/14/2019  ? Procedure: POLYPECTOMY;  Surgeon: Pasty Spillersahiliani, Varnita B, MD;  [facility];  Service: Endoscopy;  Laterality: N/A;  Stomach  ? ? ? ?Home Medications  ? ?Prior to Admission medications   ?Medication Sig Start Date End Date Taking? Authorizing Provider  ?ARMOUR THYROID 90 MG tablet TAKE 1 TABLET DAILY 10/10/21   Olevia PerchesJohnson, Megan P, DO  ?B Complex Vitamins (VITAMIN B COMPLEX PO) Take by mouth daily.    [provider]  ?losartan (COZAAR) 25 MG tablet Take 0.5 tablets (12.5 mg total) by mouth daily. 10/18/21   Dorcas CarrowJohnson, Megan P, DO  ?Menaquinone-7 (VITAMIN K2 PO) Take by mouth daily.    [provider]  ?VITAMIN D PO Take by mouth daily.    [provider]  ? ? ? ?Allergies  ?Metformin and related ? ? ?Family History  ? ?Family History  ?Problem Relation Age of Onset  ?? Glaucoma Mother   ?? Cataracts Mother   ?? Hypertension Father   ?? Fibromyalgia Sister   ?? Obesity Sister   ?? Thyroid  disease Son   ?? Raynaud syndrome Paternal Grandmother   ?? Diabetes Mellitus II Paternal Grandfather   ?? Diabetes Paternal Grandfather   ? ? ? ?Physical Exam  ?Triage Vital Signs: ?ED Triage Vitals  ?Enc Vitals Group  ?   BP 11/05/21 2117 (!) 161/80  ?   Pulse Rate 11/05/21 2117 63  ?   Resp 11/05/21 2117 19  ?   Temp 11/05/21 2117 98.2 ?F (36.8 ?C)  ?   Temp Source 11/05/21 2117 Oral  ?   SpO2 11/05/21 2117 97 %  ?   Weight 11/05/21 2118 251 lb 1.7 oz (113.9 kg)  ?   Height 11/05/21 2118 5\' 7"  (1.702 m)  ?   Head Circumference --   ?   Peak Flow --   ?   Pain Score 11/05/21 2115 7  ?   Pain  Loc --   ?   Pain Edu? --   ?   Excl. in GC? --   ? ? ?Updated Vital Signs: ?BP 134/89   Pulse 61   Temp 98.2 ?F (36.8 ?C) (Oral)   Resp 20   Ht 5\' 7"  (1.702 m)   Wt 113.9 kg   SpO2 97%   BMI 39.33 kg/m?  ? ? ?General: Awake, no distress.  ?CV:  RRR.  Good peripheral perfusion.  ?Resp:  Normal effort.  CTA B. ?Abd:  Nontender.  No distention.  ?Other:  Calves are nonswollen and nontender. ? ? ?ED Results / Procedures / Treatments  ?Labs ?(all labs ordered are listed, but only abnormal results are displayed) ?Labs Reviewed  ?BASIC METABOLIC PANEL - Abnormal; Notable for the following components:  ?    Result Value  ? Glucose, Bld 110 (*)   ? All other components within normal limits  ?CBC - Abnormal; Notable for the following components:  ? WBC 14.1 (*)   ? All other components within normal limits  ?LIPASE, BLOOD  ?HEPATIC FUNCTION PANEL  ?D-DIMER, QUANTITATIVE  ?TROPONIN I (HIGH SENSITIVITY)  ?TROPONIN I (HIGH SENSITIVITY)  ? ? ? ?EKG ? ?ED ECG REPORT ?I, 2116, the attending physician, personally viewed and interpreted this ECG. ? ? Date: 11/05/2021 ? EKG Time: 2114 ? Rate: 69 ? Rhythm: normal sinus rhythm ? Axis: Normal ? Intervals:none ? ST&T Change: Nonspecific ? ? ? ?RADIOLOGY ?I have independently visualized and interpreted patient's chest x-ray and ultrasound as well as noted the radiology interpretation: ? ?Chest x-ray: No acute cardiopulmonary process ? ?Ultrasound: Unremarkable, right renal cyst ? ?Official radiology report(s): ?DG Chest 2 View ? ?Result Date: 11/05/2021 ?CLINICAL DATA:  Chest pain. EXAM: CHEST - 2 VIEW COMPARISON:  Chest radiograph dated 03/09/2019. FINDINGS: The heart size and mediastinal contours are within normal limits. Both lungs are clear. The visualized skeletal structures are unremarkable. IMPRESSION: No active cardiopulmonary disease. Electronically Signed   By: 01/05/2022 M.D.   On: 11/05/2021 21:36  ? ?Elgie Collard ABDOMEN LIMITED RUQ (LIVER/GB) ? ?Result Date:  11/06/2021 ?CLINICAL DATA:  Upper abdominal pain. EXAM: ULTRASOUND ABDOMEN LIMITED RIGHT UPPER QUADRANT COMPARISON:  Right upper quadrant ultrasound 07/30/2018 FINDINGS: Gallbladder: No gallstones or wall thickening visualized. No sonographic Murphy sign noted by sonographer. Common bile duct: Diameter: 3.6 mm.  No intrahepatic biliary dilatation is seen Liver: No focal lesion identified. There is increased hepatic echogenicity of steatosis. Portal vein is patent on color Doppler imaging with normal direction of blood flow towards the liver. Other: 4.6 cm simple cyst lower pole right kidney, previously  3.8 cm. IMPRESSION: 1. No findings of cholelithiasis or acute cholecystitis. 2. Increased hepatic echogenicity of steatosis. 3. 4.6 cm simple right renal cyst, previously 3.8 cm. Electronically Signed   By: Almira Bar M.D.   On: 11/06/2021 00:48   ? ? ?PROCEDURES: ? ?Critical Care performed: No ? ?.1-3 Lead EKG Interpretation ?Performed by: Irean Hong, MD ?Authorized by: Irean Hong, MD  ? ?  Interpretation: normal   ?  ECG rate:  65 ?  ECG rate assessment: normal   ?  Rhythm: sinus rhythm   ?  Ectopy: none   ?  Conduction: normal   ?Comments:  ?   Patient placed on cardiac monitor to evaluate for arrhythmias ? ? ?MEDICATIONS ORDERED IN ED: ?Medications  ?famotidine (PEPCID) IVPB 20 mg premix (0 mg Intravenous Stopped 11/06/21 0052)  ? ? ? ?IMPRESSION / MDM / ASSESSMENT AND PLAN / ED COURSE  ?I reviewed the triage vital signs and the nursing notes. ?             ?               ?51 year old female presenting with right-sided chest pain. Differential diagnosis includes, but is not limited to, ACS, aortic dissection, pulmonary embolism, cardiac tamponade, pneumothorax, pneumonia, pericarditis, myocarditis, GI-related causes including esophagitis/gastritis, and musculoskeletal chest wall pain.   I have personally reviewed patient's records and see that she had a recent PCP office visit on 10/18/2021 for benign  hypertensive renal disease. ? ?The patient is on the cardiac monitor to evaluate for evidence of arrhythmia and/or significant heart rate changes. ? ?Lab results demonstrate mild leukocytosis WBC 14, normal electrolytes, initi

## 2021-11-06 ENCOUNTER — Emergency Department: Payer: BC Managed Care – PPO

## 2021-11-06 ENCOUNTER — Other Ambulatory Visit: Payer: BC Managed Care – PPO

## 2021-11-06 DIAGNOSIS — R1010 Upper abdominal pain, unspecified: Secondary | ICD-10-CM | POA: Diagnosis not present

## 2021-11-06 LAB — HEPATIC FUNCTION PANEL
ALT: 27 U/L (ref 0–44)
AST: 30 U/L (ref 15–41)
Albumin: 4 g/dL (ref 3.5–5.0)
Alkaline Phosphatase: 68 U/L (ref 38–126)
Bilirubin, Direct: <0.1 mg/dL (ref 0.0–0.2)
Total Bilirubin: 0.5 mg/dL (ref 0.3–1.2)
Total Protein: 7.5 g/dL (ref 6.5–8.1)

## 2021-11-06 LAB — TROPONIN I (HIGH SENSITIVITY): Troponin I (High Sensitivity): 15 ng/L (ref <18)

## 2021-11-06 LAB — D-DIMER, QUANTITATIVE: D-Dimer, Quant: 0.29 ug/mL-FEU (ref 0.00–0.50)

## 2021-11-06 NOTE — ED Notes (Signed)
Discharge instructions discussed with pt and s/o at bedside. [T was able to verbalize understanding with no questions at this time. Pt to follow up with cardiology  ?

## 2021-11-06 NOTE — Discharge Instructions (Signed)
Take your Naprosyn and apply moist heat to affected area as needed for discomfort. Return to the ER for worsening symptoms, persistent vomiting, difficulty breathing or other concerns. ?

## 2021-11-08 ENCOUNTER — Telehealth: Payer: Self-pay | Admitting: *Deleted

## 2021-11-08 NOTE — Telephone Encounter (Signed)
Transition Care Management Unsuccessful Follow-up Telephone Call ? ?Date of discharge and from where:  [facility] 11-06-2021 ? ?Attempts:  1st Attempt ? ?Reason for unsuccessful TCM follow-up call:  Left voice message ? ?  ?

## 2021-11-15 ENCOUNTER — Ambulatory Visit (INDEPENDENT_AMBULATORY_CARE_PROVIDER_SITE_OTHER): Payer: BC Managed Care – PPO | Admitting: Family Medicine

## 2021-11-15 ENCOUNTER — Encounter: Payer: Self-pay | Admitting: Family Medicine

## 2021-11-15 VITALS — BP 142/84 | HR 56 | Temp 98.0°F | Wt 252.2 lb

## 2021-11-15 DIAGNOSIS — E119 Type 2 diabetes mellitus without complications: Secondary | ICD-10-CM | POA: Diagnosis not present

## 2021-11-15 DIAGNOSIS — R079 Chest pain, unspecified: Secondary | ICD-10-CM | POA: Diagnosis not present

## 2021-11-15 LAB — BAYER DCA HB A1C WAIVED: HB A1C (BAYER DCA - WAIVED): 6.4 % — ABNORMAL HIGH (ref 4.8–5.6)

## 2021-11-15 NOTE — Assessment & Plan Note (Signed)
Stable with A1c of 6.4. She declines medication at this time. Continue to monitor. Recheck 3 months.  ?

## 2021-11-15 NOTE — Progress Notes (Signed)
? ?BP (!) 142/84   Pulse (!) 56   Temp 98 ?F (36.7 ?C)   Wt 252 lb 3.2 oz (114.4 kg)   SpO2 100%   BMI 39.50 kg/m?   ? ?Subjective:  ? ? Patient ID: Teresa Peters, female    DOB: [birth year], 51 y.o.   MRN: [id number] ? ?HPI: ?Teresa Peters is a 51 y.o. female ? ?Chief Complaint  ?Patient presents with  ? Diabetes  ? ?DIABETES ?Hypoglycemic episodes:no ?Polydipsia/polyuria: no ?Visual disturbance: no ?Chest pain: yes ?Paresthesias: no ?Glucose Monitoring: no ? Accucheck frequency: Not Checking ?Taking Insulin?: no ?Blood Pressure Monitoring: not checking ?Retinal Examination: Not up to Date ?Foot Exam: Up to Date ?Diabetic Education: Completed ?Pneumovax: Up to Date ?Influenza: Up to Date ?Aspirin: no ? ?ER FOLLOW UP ?Time since discharge: 10 days ?Hospital/facility: [facility] ?Diagnosis: Non-specific chest pain ?Procedures/tests: RUQ [place], EKG, CXR, Labs  ?Consultants: none ?New medications: naproxen ?Discharge instructions:  follow up here ?Status: better- resolved on its own when she got back from the hospital. Has not been taking anything for it.  ? ?Relevant past medical, surgical, family and social history reviewed and updated as indicated. Interim medical history since our last visit reviewed. ?Allergies and medications reviewed and updated. ? ?Review of Systems  ?Constitutional: Negative.   ?Respiratory: Negative.    ?Cardiovascular: Negative.   ?Gastrointestinal: Negative.   ?Musculoskeletal: Negative.   ?Neurological: Negative.   ?Psychiatric/Behavioral: Negative.    ? ?Per HPI unless specifically indicated above ? ?   ?Objective:  ?  ?BP (!) 142/84   Pulse (!) 56   Temp 98 ?F (36.7 ?C)   Wt 252 lb 3.2 oz (114.4 kg)   SpO2 100%   BMI 39.50 kg/m?   ?Wt Readings from Last 3 Encounters:  ?11/15/21 252 lb 3.2 oz (114.4 kg)  ?11/05/21 251 lb 1.7 oz (113.9 kg)  ?10/18/21 251 lb (113.9 kg)  ?  ?Physical Exam ?Vitals and nursing note reviewed.  ?Constitutional:   ?   General: She is not in acute  distress. ?   Appearance: Normal appearance. She is obese. She is not ill-appearing, toxic-appearing or diaphoretic.  ?HENT:  ?   Head: Normocephalic and atraumatic.  ?   Right Ear: External ear normal.  ?   Left Ear: External ear normal.  ?   Nose: Nose normal.  ?   Mouth/Throat:  ?   Mouth: Mucous membranes are moist.  ?   Pharynx: Oropharynx is clear.  ?Eyes:  ?   General: No scleral icterus.    ?   Right eye: No discharge.     ?   Left eye: No discharge.  ?   Extraocular Movements: Extraocular movements intact.  ?   Conjunctiva/sclera: Conjunctivae normal.  ?   Pupils: Pupils are equal, round, and reactive to light.  ?Cardiovascular:  ?   Rate and Rhythm: Normal rate and regular rhythm.  ?   Pulses: Normal pulses.  ?   Heart sounds: Normal heart sounds. No murmur heard. ?  No friction rub. No gallop.  ?Pulmonary:  ?   Effort: Pulmonary effort is normal. No respiratory distress.  ?   Breath sounds: Normal breath sounds. No stridor. No wheezing, rhonchi or rales.  ?Chest:  ?   Chest wall: No tenderness.  ?Musculoskeletal:     ?   General: Normal range of motion.  ?   Cervical back: Normal range of motion and neck supple.  ?Skin: ?   General: Skin  is warm and dry.  ?   Capillary Refill: Capillary refill takes less than 2 seconds.  ?   Coloration: Skin is not jaundiced or pale.  ?   Findings: No bruising, erythema, lesion or rash.  ?Neurological:  ?   General: No focal deficit present.  ?   Mental Status: She is alert and oriented to person, place, and time. Mental status is at baseline.  ?Psychiatric:     ?   Mood and Affect: Mood normal.     ?   Behavior: Behavior normal.     ?   Thought Content: Thought content normal.     ?   Judgment: Judgment normal.  ? ? ?Results for orders placed or performed during the hospital encounter of 11/05/21  ?Basic metabolic panel  ?Result Value Ref Range  ? Sodium 140 135 - 145 mmol/L  ? Potassium 3.8 3.5 - 5.1 mmol/L  ? Chloride 104 98 - 111 mmol/L  ? CO2 26 22 - 32 mmol/L  ?  Glucose, Bld 110 (H) 70 - 99 mg/dL  ? BUN 14 6 - 20 mg/dL  ? Creatinine, Ser 0.77 0.44 - 1.00 mg/dL  ? Calcium 9.2 8.9 - 10.3 mg/dL  ? GFR, Estimated >60 >60 mL/min  ? Anion gap 10 5 - 15  ?CBC  ?Result Value Ref Range  ? WBC 14.1 (H) 4.0 - 10.5 K/uL  ? RBC 4.94 3.87 - 5.11 MIL/uL  ? Hemoglobin 13.2 12.0 - 15.0 g/dL  ? HCT 41.3 36.0 - 46.0 %  ? MCV 83.6 80.0 - 100.0 fL  ? MCH 26.7 26.0 - 34.0 pg  ? MCHC 32.0 30.0 - 36.0 g/dL  ? RDW 14.4 11.5 - 15.5 %  ? Platelets 256 150 - 400 K/uL  ? nRBC 0.0 0.0 - 0.2 %  ?Lipase, blood  ?Result Value Ref Range  ? Lipase 34 11 - 51 U/L  ?Hepatic function panel  ?Result Value Ref Range  ? Total Protein 7.5 6.5 - 8.1 g/dL  ? Albumin 4.0 3.5 - 5.0 g/dL  ? AST 30 15 - 41 U/L  ? ALT 27 0 - 44 U/L  ? Alkaline Phosphatase 68 38 - 126 U/L  ? Total Bilirubin 0.5 0.3 - 1.2 mg/dL  ? Bilirubin, Direct <0.1 0.0 - 0.2 mg/dL  ? Indirect Bilirubin NOT CALCULATED 0.3 - 0.9 mg/dL  ?D-dimer, quantitative  ?Result Value Ref Range  ? D-Dimer, Quant 0.29 0.00 - 0.50 ug/mL-FEU  ?Troponin I (High Sensitivity)  ?Result Value Ref Range  ? Troponin I (High Sensitivity) 13 <18 ng/L  ?Troponin I (High Sensitivity)  ?Result Value Ref Range  ? Troponin I (High Sensitivity) 15 <18 ng/L  ? ?   ?Assessment & Plan:  ? ?Problem List Items Addressed This Visit   ? ?  ? Endocrine  ? Diet-controlled diabetes mellitus (HCC) - Primary  ?  Stable with A1c of 6.4. She declines medication at this time. Continue to monitor. Recheck 3 months.  ? ?  ?  ? Relevant Orders  ? Bayer DCA Hb A1c Waived  ? ?Other Visit Diagnoses   ? ? Chest pain, unspecified type      ? Will continue to monitor. If happens again will get her into cardiology for further evaluation. Call with any concerns.  ? ?  ?  ? ?Follow up plan: ?Return in about 5 months (around 04/17/2022) for physical. ? ? ? ? ? ?

## 2022-04-18 ENCOUNTER — Encounter: Payer: BC Managed Care – PPO | Admitting: Family Medicine

## 2022-07-05 ENCOUNTER — Other Ambulatory Visit: Payer: Self-pay | Admitting: Family Medicine

## 2022-07-05 NOTE — Telephone Encounter (Signed)
Requested Prescriptions  Pending Prescriptions Disp Refills   ARMOUR THYROID 90 MG tablet [Pharmacy Med Name: ARMOUR THYROID TABS 1&1/2GR 90MG ] 90 tablet 1    Sig: TAKE 1 TABLET DAILY     Endocrinology:  Hypothyroid Agents Passed - 07/05/2022 12:34 AM      Passed - TSH in normal range and within 360 days    TSH  Date Value Ref Range Status  10/18/2021 0.954 0.450 - 4.500 uIU/mL Final         Passed - Valid encounter within last 12 months    Recent Outpatient Visits           7 months ago Diet-controlled diabetes mellitus ([place])   [place], Megan P, DO   8 months ago Benign hypertensive renal disease   [facility] Johnson, Megan P, DO   9 months ago Painful urination   [place], MD   10 months ago Diet-controlled diabetes mellitus [facility])   [place], Megan P, DO   1 year ago Diet-controlled diabetes mellitus ([place])   [place], [place], DO

## 2023-01-01 DIAGNOSIS — E119 Type 2 diabetes mellitus without complications: Secondary | ICD-10-CM | POA: Diagnosis not present

## 2023-01-01 DIAGNOSIS — H40003 Preglaucoma, unspecified, bilateral: Secondary | ICD-10-CM | POA: Diagnosis not present

## 2023-02-01 ENCOUNTER — Ambulatory Visit (INDEPENDENT_AMBULATORY_CARE_PROVIDER_SITE_OTHER): Payer: BC Managed Care – PPO | Admitting: Gastroenterology

## 2023-02-01 ENCOUNTER — Encounter: Payer: Self-pay | Admitting: Gastroenterology

## 2023-02-01 VITALS — BP 145/83 | HR 68 | Temp 98.2°F | Ht 67.0 in | Wt 245.0 lb

## 2023-02-01 DIAGNOSIS — K5904 Chronic idiopathic constipation: Secondary | ICD-10-CM | POA: Diagnosis not present

## 2023-02-01 DIAGNOSIS — R109 Unspecified abdominal pain: Secondary | ICD-10-CM | POA: Diagnosis not present

## 2023-02-01 DIAGNOSIS — K317 Polyp of stomach and duodenum: Secondary | ICD-10-CM | POA: Diagnosis not present

## 2023-02-01 NOTE — Progress Notes (Signed)
Gastroenterology Consultation  Referring Provider:     Dorcas Carrow, DO Primary Care Physician:  Dorcas Carrow, DO Primary Gastroenterologist:  Dr. Servando Snare     Reason for Consultation:     Abdominal pain        HPI:   Teresa Peters is a 52 y.o. y/o female referred for consultation & management of abdominal pain by Dr. Laural Benes, Megan P, DO.  This patient comes in today with a history of having an upper endoscopy and colonoscopy by Dr. Maximino Greenland in the past.  The patient had a normal colonoscopy but had multiple polyps in the stomach that were benign.  The patient upper endoscopy was done due to her report of some abdominal pain in the right upper quadrant. The patient now reports that she has pain in the left side.  The patient also has a history of constipation.  The patient states that her left-sided pain is worse when she bends over.  There is no report of any association with any other GI symptoms.  The patient reports that she is always concerned about cancer and had nausea a short time ago which spontaneously resolved but she was concerned that it may have been a symptom of a pancreatic issue.  The patient also describes pains in her chest that sound like esophageal spasms. She denies any unexplained weight loss fevers chills nausea or vomiting at the present time.  She does report that she does suffer from constipation.  Past Medical History:  Diagnosis Date   Anemia    Cellulitis    Diabetes mellitus without complication (HCC)    Fatty liver    Hypothyroidism    Obesity    Prediabetes     Past Surgical History:  Procedure Laterality Date   ADENOIDECTOMY     ADENOIDECTOMY     COLONOSCOPY WITH PROPOFOL N/A 01/14/2019   Procedure: COLONOSCOPY WITH PROPOFOL;  Surgeon: Pasty Spillers, MD;  Location: [facility];  Service: Endoscopy;  Laterality: N/A;   ESOPHAGOGASTRODUODENOSCOPY  2012   polyps (pt states they were not removed)   ESOPHAGOGASTRODUODENOSCOPY  (EGD) WITH PROPOFOL N/A 01/14/2019   Procedure: ESOPHAGOGASTRODUODENOSCOPY (EGD) WITH BIOPSY;  Surgeon: Pasty Spillers, MD;  Location: [facility];  Service: Endoscopy;  Laterality: N/A;   FOOT SURGERY     POLYPECTOMY N/A 01/14/2019   Procedure: POLYPECTOMY;  Surgeon: Pasty Spillers, MD;  Location: [facility];  Service: Endoscopy;  Laterality: N/A;  Stomach    Prior to Admission medications   Medication Sig Start Date End Date Taking? Authorizing Provider  B Complex Vitamins (VITAMIN B COMPLEX PO) Take by mouth daily.    [provider]  losartan (COZAAR) 25 MG tablet Take 0.5 tablets (12.5 mg total) by mouth daily. 10/18/21   Johnson, Megan P, DO  Menaquinone-7 (VITAMIN K2 PO) Take by mouth daily.    [provider]  thyroid (ARMOUR THYROID) 90 MG tablet TAKE 1 TABLET DAILY 07/05/22   Olevia Perches P, DO  VITAMIN D PO Take by mouth daily.    [provider]    Family History  Problem Relation Age of Onset   Glaucoma Mother    Cataracts Mother    Hypertension Father    Fibromyalgia Sister    Obesity Sister    Thyroid disease Son    Raynaud syndrome Paternal Grandmother    Diabetes Mellitus II Paternal Grandfather    Diabetes Paternal Grandfather      Social  History   Tobacco Use   Smoking status: Never   Smokeless tobacco: Never  Vaping Use   Vaping status: Never Used  Substance Use Topics   Alcohol use: Yes    Comment: rare - 1x/month   Drug use: No    Allergies as of 02/01/2023 - Review Complete 11/15/2021  Allergen Reaction Noted   Metformin and related  01/06/2019    Review of Systems:    All systems reviewed and negative except where noted in HPI.   Physical Exam:  There were no vitals taken for this visit. No LMP recorded. General:   Alert,  Well-developed, well-nourished, pleasant and cooperative in NAD Head:  Normocephalic and atraumatic. Eyes:  Sclera clear, no icterus.   Conjunctiva pink. Ears:   Normal auditory acuity. Neck:  Supple; no masses or thyromegaly. Lungs:  Respirations even and unlabored.  Clear throughout to auscultation.   No wheezes, crackles, or rhonchi. No acute distress. Heart:  Regular rate and rhythm; no murmurs, clicks, rubs, or gallops. Abdomen:  Normal bowel sounds.  No bruits.  Soft, positive tenderness on the ribs on the left side reproducible with flexion of the abdominal wall muscles with light palpation and non-distended without masses, hepatosplenomegaly or hernias noted.  No guarding or rebound tenderness.  Positive Carnett sign.   Rectal:  Deferred.  Pulses:  Normal pulses noted. Extremities:  No clubbing or edema.  No cyanosis. Neurologic:  Alert and oriented x3;  grossly normal neurologically. Skin:  Intact without significant lesions or rashes.  No jaundice. Lymph Nodes:  No significant cervical adenopathy. Psych:  Alert and cooperative. Normal mood and affect.  Imaging Studies: No results found.  Assessment and Plan:   Teresa Peters is a 52 y.o. y/o female who comes in with multiple issues.  The patient's constipation has been a problem the patient has been told to start taking something on a regular basis like fiber or MiraLAX daily so that she does not get constipated.  The patient has also been told that her esophageal spasms may be a result of reflux and she should try to avoid things that may potentiate the esophageal spasms such as very cold drinks or having reflux.  The patient has also been explained the history of her gastric polyps and what the pathology showed.  We discussed weight loss management and I informed her that a Mediterranean diet was a good option for weight loss.  The patient's left side abdominal pain was clearly musculoskeletal and the patient has been explained that.  The patient has been explained the plan and agrees with it.    Midge Minium, MD. Clementeen Graham    Note: This dictation was prepared with Dragon dictation along  with smaller phrase technology. Any transcriptional errors that result from this process are unintentional.

## 2023-02-23 ENCOUNTER — Ambulatory Visit: Payer: BC Managed Care – PPO | Admitting: Family Medicine

## 2023-03-13 ENCOUNTER — Ambulatory Visit: Payer: BC Managed Care – PPO | Admitting: Gastroenterology
